# Patient Record
Sex: Female | Born: 1945 | Race: White | Hispanic: No | Marital: Married | Smoking: Never smoker
Health system: Southern US, Community
[De-identification: ages and names within clinical notes are randomized; demographics above are authoritative.]

## PROBLEM LIST (undated history)

## (undated) DIAGNOSIS — M1990 Unspecified osteoarthritis, unspecified site: Secondary | ICD-10-CM

## (undated) DIAGNOSIS — D759 Disease of blood and blood-forming organs, unspecified: Secondary | ICD-10-CM

## (undated) DIAGNOSIS — Z973 Presence of spectacles and contact lenses: Secondary | ICD-10-CM

## (undated) DIAGNOSIS — T4145XA Adverse effect of unspecified anesthetic, initial encounter: Secondary | ICD-10-CM

## (undated) DIAGNOSIS — I10 Essential (primary) hypertension: Secondary | ICD-10-CM

## (undated) DIAGNOSIS — K219 Gastro-esophageal reflux disease without esophagitis: Secondary | ICD-10-CM

## (undated) DIAGNOSIS — F329 Major depressive disorder, single episode, unspecified: Secondary | ICD-10-CM

## (undated) DIAGNOSIS — J302 Other seasonal allergic rhinitis: Secondary | ICD-10-CM

## (undated) DIAGNOSIS — F419 Anxiety disorder, unspecified: Secondary | ICD-10-CM

## (undated) DIAGNOSIS — F32A Depression, unspecified: Secondary | ICD-10-CM

## (undated) DIAGNOSIS — T8859XA Other complications of anesthesia, initial encounter: Secondary | ICD-10-CM

## (undated) HISTORY — PX: APPENDECTOMY: SHX54

## (undated) HISTORY — PX: COLONOSCOPY: SHX174

## (undated) HISTORY — PX: DILATION AND CURETTAGE OF UTERUS: SHX78

## (undated) NOTE — *Deleted (*Deleted)
[facility] EMERGENCY DEPARTMENT Provider Note   CSN: [id number] Arrival date & time: 01/11/20  1147     History Chief Complaint  Patient presents with  . Headache    Melissa Wilkinson is a 49 y.o. female.  HPI     Past Medical History:  Diagnosis Date  . Anxiety   . Arthritis   . Blood dyscrasia    family history of c protien deficiency   . Complication of anesthesia    difficult waking after c section  . Depression   . GERD (gastroesophageal reflux disease)   . Hypertension   . Seasonal allergies   . Wears glasses     Patient Active Problem List   Diagnosis Date Noted  . DJD (degenerative joint disease) of knee 03/20/2013    Past Surgical History:  Procedure Laterality Date  . APPENDECTOMY    . CESAREAN SECTION     x2  . COLONOSCOPY    . DILATION AND CURETTAGE OF UTERUS    . KNEE ARTHROSCOPY  1989   left  . KNEE ARTHROSCOPY WITH MEDIAL MENISECTOMY Right 09/13/2012   Procedure: RIGHT KNEE ARTHROSCOPY WITH PARTIAL MEDIAL LATERAL MENISECTOMY CHRONDROPLASTY;  Surgeon: Loreta Ave, MD;  Location: [facility];  Service: Orthopedics;  Laterality: Right;  . TOTAL KNEE ARTHROPLASTY Right 03/20/2013   DR MURPHY  . TOTAL KNEE ARTHROPLASTY Right 03/20/2013   Procedure: RIGHT TOTAL KNEE ARTHROPLASTY;  Surgeon: Loreta Ave, MD;  Location: [facility] OR;  Service: Orthopedics;  Laterality: Right;     OB History   No obstetric history on file.     No family history on file.  Social History   Tobacco Use  . Smoking status: Never Smoker  . Smokeless tobacco: Never Used  Substance Use Topics  . Alcohol use: Yes    Comment: daily to occ  . Drug use: No    Home Medications Prior to Admission medications   Medication Sig Start Date End Date Taking? Authorizing Provider  ALPRAZolam Prudy Feeler) 1 MG tablet Take 0.5-1 mg by mouth 3 (three) times daily as needed for anxiety or sleep.     [provider]  aspirin EC 325 MG EC tablet  Take 1 tablet (325 mg total) by mouth daily with breakfast. 03/21/13   Cristie Hem, PA-C  aspirin EC 325 MG tablet Take 1 tablet (325 mg total) by mouth daily. 03/20/13   Cristie Hem, PA-C  diclofenac sodium (VOLTAREN) 1 % GEL APPLY 2 GRAMS TO AFFECTED AREA 4 TIMES A DAY 08/29/18   Tarry Kos, MD  DULoxetine (CYMBALTA) 60 MG capsule Take 60 mg by mouth every evening.     [provider]  fluticasone (FLONASE) 50 MCG/ACT nasal spray Place 2 sprays into the nose daily as needed for allergies. For allergy symptoms    [provider]  Fluticasone-Salmeterol (ADVAIR) 250-50 MCG/DOSE AEPB Inhale 1 puff into the lungs daily as needed (asthma).    [provider]  ketotifen (ZADITOR) 0.025 % ophthalmic solution Place 2 drops into both eyes daily as needed (allergies).    [provider]  methocarbamol (ROBAXIN) 500 MG tablet Take 1 tablet (500 mg total) by mouth every 6 (six) hours as needed for muscle spasms. 03/20/13   Cristie Hem, PA-C  olmesartan-hydrochlorothiazide (BENICAR HCT) 40-25 MG per tablet Take 1 tablet by mouth daily.    [provider]  omeprazole (PRILOSEC) 20 MG capsule Take 20 mg by mouth daily.  [provider]  ondansetron (ZOFRAN) 4 MG tablet Take 1 tablet (4 mg total) by mouth every 8 (eight) hours as needed for nausea or vomiting. 03/20/13   Cristie Hem, PA-C  oxyCODONE-acetaminophen (PERCOCET/ROXICET) 5-325 MG per tablet Take 1-2 tablets by mouth every 4 (four) hours as needed for severe pain. 03/21/13   Cristie Hem, PA-C  oxyCODONE-acetaminophen (ROXICET) 5-325 MG per tablet Take 1-2 tablets by mouth every 4 (four) hours as needed for severe pain. 03/20/13   Cristie Hem, PA-C  predniSONE (STERAPRED UNI-PAK 21 TAB) 10 MG (21) TBPK tablet Take as directed 10/06/17   Tarry Kos, MD  traMADol (ULTRAM) 50 MG tablet Take 1 tablet (50 mg total) by mouth 2 (two) times daily as needed. 08/10/18   Cristie Hem, PA-C  VALTREX 500 MG tablet Take 1 tablet (500 mg total) by mouth daily. 03/21/13   Cristie Hem, PA-C    Allergies    Hydrocodone and Morphine and related  Review of Systems   Review of Systems  Physical Exam Updated Vital Signs BP 129/64 (BP Location: Right Arm)   Pulse 89   Temp 98.6 F (37 C) (Oral)   Resp 16   Ht 5' (1.524 m)   Wt 72.6 kg   SpO2 100%   BMI 31.25 kg/m   Physical Exam  ED Results / Procedures / Treatments   Labs (all labs ordered are listed, but only abnormal results are displayed) Labs Reviewed - No data to display  EKG None  Radiology CT Head Wo Contrast  Result Date: 01/11/2020 CLINICAL DATA:  Headache, intracranial hemorrhage suspected EXAM: CT HEAD WITHOUT CONTRAST TECHNIQUE: Contiguous axial images were obtained from the base of the skull through the vertex without intravenous contrast. COMPARISON:  None. FINDINGS: Brain: No evidence of acute infarction, hemorrhage, hydrocephalus, extra-axial collection or mass lesion/mass effect. Mild periventricular and deep white matter hypodensity. Vascular: No hyperdense vessel or unexpected calcification. Skull: Normal. Negative for fracture or focal lesion. Sinuses/Orbits: No acute finding. Other: None. IMPRESSION: 1. No acute intracranial pathology. No non-contrast CT findings to explain headache. 2. Small-vessel white matter disease. Electronically Signed   By: Lauralyn Primes M.D.   On: 01/11/2020 13:21    Procedures Procedures (including critical care time)  Medications Ordered in ED Medications - No data to display  ED Course  I have reviewed the triage vital signs and the nursing notes.  Pertinent labs & imaging results that were available during my care of the patient were reviewed by me and considered in my medical decision making (see chart for details).    MDM Rules/Calculators/A&P                          *** Final Clinical Impression(s) / ED Diagnoses Final diagnoses:  None     Rx / DC Orders ED Discharge Orders    None

---

## 1987-02-22 HISTORY — PX: KNEE ARTHROSCOPY: SUR90

## 1997-07-31 ENCOUNTER — Other Ambulatory Visit: Admission: RE | Admit: 1997-07-31 | Discharge: 1997-07-31 | Payer: Self-pay | Admitting: Obstetrics and Gynecology

## 1998-09-30 ENCOUNTER — Other Ambulatory Visit: Admission: RE | Admit: 1998-09-30 | Discharge: 1998-09-30 | Payer: Self-pay | Admitting: Obstetrics and Gynecology

## 2001-11-02 ENCOUNTER — Other Ambulatory Visit: Admission: RE | Admit: 2001-11-02 | Discharge: 2001-11-02 | Payer: Self-pay | Admitting: Obstetrics and Gynecology

## 2007-03-21 ENCOUNTER — Ambulatory Visit: Payer: Self-pay | Admitting: Psychiatry

## 2008-09-18 ENCOUNTER — Other Ambulatory Visit: Admission: RE | Admit: 2008-09-18 | Discharge: 2008-09-18 | Payer: Self-pay | Admitting: Family Medicine

## 2011-11-24 DIAGNOSIS — I10 Essential (primary) hypertension: Secondary | ICD-10-CM | POA: Diagnosis not present

## 2011-11-24 DIAGNOSIS — M2550 Pain in unspecified joint: Secondary | ICD-10-CM | POA: Diagnosis not present

## 2011-11-24 DIAGNOSIS — F411 Generalized anxiety disorder: Secondary | ICD-10-CM | POA: Diagnosis not present

## 2011-11-24 DIAGNOSIS — K219 Gastro-esophageal reflux disease without esophagitis: Secondary | ICD-10-CM | POA: Diagnosis not present

## 2011-12-01 DIAGNOSIS — Z1231 Encounter for screening mammogram for malignant neoplasm of breast: Secondary | ICD-10-CM | POA: Diagnosis not present

## 2011-12-28 ENCOUNTER — Emergency Department (HOSPITAL_COMMUNITY): Payer: Medicare Other

## 2011-12-28 ENCOUNTER — Emergency Department (HOSPITAL_COMMUNITY)
Admission: EM | Admit: 2011-12-28 | Discharge: 2011-12-28 | Disposition: A | Discharge status: Home / Self Care | Payer: Medicare Other | Attending: Emergency Medicine | Admitting: Emergency Medicine

## 2011-12-28 ENCOUNTER — Encounter (HOSPITAL_COMMUNITY): Payer: Self-pay | Admitting: Emergency Medicine

## 2011-12-28 DIAGNOSIS — Y92009 Unspecified place in unspecified non-institutional (private) residence as the place of occurrence of the external cause: Secondary | ICD-10-CM | POA: Insufficient documentation

## 2011-12-28 DIAGNOSIS — S51812A Laceration without foreign body of left forearm, initial encounter: Secondary | ICD-10-CM

## 2011-12-28 DIAGNOSIS — S51009A Unspecified open wound of unspecified elbow, initial encounter: Secondary | ICD-10-CM | POA: Diagnosis not present

## 2011-12-28 DIAGNOSIS — Y9389 Activity, other specified: Secondary | ICD-10-CM | POA: Insufficient documentation

## 2011-12-28 DIAGNOSIS — Z791 Long term (current) use of non-steroidal anti-inflammatories (NSAID): Secondary | ICD-10-CM | POA: Diagnosis not present

## 2011-12-28 DIAGNOSIS — S51809A Unspecified open wound of unspecified forearm, initial encounter: Secondary | ICD-10-CM | POA: Diagnosis not present

## 2011-12-28 DIAGNOSIS — I10 Essential (primary) hypertension: Secondary | ICD-10-CM | POA: Insufficient documentation

## 2011-12-28 DIAGNOSIS — W01119A Fall on same level from slipping, tripping and stumbling with subsequent striking against unspecified sharp object, initial encounter: Secondary | ICD-10-CM | POA: Insufficient documentation

## 2011-12-28 DIAGNOSIS — Z8739 Personal history of other diseases of the musculoskeletal system and connective tissue: Secondary | ICD-10-CM | POA: Diagnosis not present

## 2011-12-28 HISTORY — DX: Essential (primary) hypertension: I10

## 2011-12-28 HISTORY — DX: Unspecified osteoarthritis, unspecified site: M19.90

## 2011-12-28 MED ORDER — HYDROCODONE-ACETAMINOPHEN 5-325 MG PO TABS: 2.0000 | ORAL_TABLET | ORAL | Status: DC | PRN

## 2011-12-28 NOTE — ED Notes (Signed)
Pt fell on glass cup.  AC has large skin tear. Pt able to move all fingers. No complain of pain. Saline gauze placed on wound and suture cart placed in hall.

## 2011-12-28 NOTE — ED Provider Notes (Signed)
Medical screening examination/treatment/procedure(s) were performed by non-physician practitioner and as supervising physician I was immediately available for consultation/collaboration.   Isley Zinni, MD 12/28/11 0657 

## 2011-12-28 NOTE — ED Provider Notes (Signed)
History     CSN: [id number]  Arrival date & time 12/28/11  0125   First MD Initiated Contact with Patient 12/28/11 [phone]      Chief Complaint  Patient presents with  . Extremity Laceration    (Consider location/radiation/quality/duration/timing/severity/associated sxs/prior treatment) HPI Comments: Patient states she fell asleep in her bed with a glass and when she went to get up and change positions.  She fell, lacerating her left antecubital fossa, with a glass that fell to the floor, and broke.  She is up to date in her tetanus is full.  Range of motion of her fingers, wrist, and elbow.  She has no numbness or tingling x-ray reveals no foreign body  The history is provided by the patient.    Past Medical History  Diagnosis Date  . Hypertension   . Arthritis     History reviewed. No pertinent past surgical history.  No family history on file.  History  Substance Use Topics  . Smoking status: Never Smoker   . Smokeless tobacco: Not on file  . Alcohol Use: No    OB History    Grav Para Term Preterm Abortions TAB SAB Ect Mult Living                  Review of Systems  Constitutional: Negative for fever and chills.  Skin: Positive for wound.  Neurological: Negative for dizziness, weakness and numbness.    Allergies  Morphine and related  Home Medications   Current Outpatient Rx  Name  Route  Sig  Dispense  Refill  . ALPRAZOLAM 1 MG PO TABS   Oral   Take 1 mg by mouth daily as needed. For anxiety         . DULOXETINE HCL 60 MG PO CPEP   Oral   Take 60 mg by mouth daily.         Marland Kitchen FLUTICASONE PROPIONATE 50 MCG/ACT NA SUSP   Nasal   Place 2 sprays into the nose daily. For allergy symptoms         . MELOXICAM 7.5 MG PO TABS   Oral   Take 7.5 mg by mouth daily as needed. For arthritis pain           BP 124/66  Pulse 96  Temp 97.6 F (36.4 C)  Resp 16  Wt 163 lb (73.936 kg)  SpO2 95%  Physical Exam  Constitutional: She is oriented to  person, place, and time. She appears well-developed and well-nourished.  HENT:  Head: Normocephalic.  Eyes: Pupils are equal, round, and reactive to light.  Neck: Normal range of motion.  Cardiovascular: Normal rate.   Pulmonary/Chest: Effort normal.  Musculoskeletal: Normal range of motion. She exhibits tenderness. She exhibits no edema.  Neurological: She is alert and oriented to person, place, and time.  Skin: Skin is warm and dry.       ED Course  LACERATION REPAIR Date/Time: 12/28/2011 3:01 AM Performed by: Arman Filter Authorized by: Arman Filter Consent: Verbal consent obtained. Risks and benefits: risks, benefits and alternatives were discussed Consent given by: patient Patient understanding: patient states understanding of the procedure being performed Patient identity confirmed: verbally with patient Time out: Immediately prior to procedure a "time out" was called to verify the correct patient, procedure, equipment, support staff and site/side marked as required. Body area: upper extremity Location details: left elbow Laceration length: 4 cm Foreign bodies: no foreign bodies Tendon involvement: none Nerve involvement: none Vascular  damage: no Anesthesia: local infiltration Local anesthetic: lidocaine 2% with epinephrine Anesthetic total: 4 ml Patient sedated: no Preparation: Patient was prepped and draped in the usual sterile fashion. Irrigation solution: saline Irrigation method: syringe Amount of cleaning: standard Debridement: none Degree of undermining: none Skin closure: 4-0 nylon Subcutaneous closure: 5-0 Chromic gut Number of sutures: 10 Technique: simple Approximation: close Approximation difficulty: simple Dressing: 4x4 sterile gauze, gauze roll and pressure dressing Patient tolerance: Patient tolerated the procedure well with no immediate complications.   (including critical care time)  Labs Reviewed - No data to display Dg Forearm  Left  12/28/2011  *RADIOLOGY REPORT*  Clinical Data: Cut arm on glass; laceration near the elbow.  LEFT FOREARM - 2 VIEW  Comparison: None.  Findings: No retained glass fragments are identified.  A prominent laceration is noted along the lateral and volar aspect of the proximal left forearm, near the elbow joint.  There is no evidence of fracture or dislocation.  The radius and ulna appear intact.  Negative ulnar variance is noted.  The carpal rows are grossly unremarkable in appearance.  No elbow joint effusion is identified.  IMPRESSION:  1.  No retained glass fragments seen. 2.  Prominent laceration along the lateral and volar aspect of the proximal forearm.   Original Report Authenticated By: Tonia Ghent, M.D.      1. Laceration of left forearm without foreign body       MDM  Laceration to the left forearm, just below the antecubital fossa proximally, 4 cm long, deeper in the central location in the lateral location.  No active bleeding at this time         Arman Filter, NP 12/28/11 1610  Arman Filter, NP 12/28/11 [phone]

## 2011-12-28 NOTE — ED Notes (Signed)
Pt alert, nad, arrives from home, c/o ? Skin tear to left elbow after fall, DSD applied at home, resp even unlabored, skin pwd

## 2012-01-10 DIAGNOSIS — H2510 Age-related nuclear cataract, unspecified eye: Secondary | ICD-10-CM | POA: Diagnosis not present

## 2012-01-10 DIAGNOSIS — H5210 Myopia, unspecified eye: Secondary | ICD-10-CM | POA: Diagnosis not present

## 2012-01-11 DIAGNOSIS — Z4802 Encounter for removal of sutures: Secondary | ICD-10-CM | POA: Diagnosis not present

## 2012-01-11 DIAGNOSIS — Z1331 Encounter for screening for depression: Secondary | ICD-10-CM | POA: Diagnosis not present

## 2012-01-11 DIAGNOSIS — Z23 Encounter for immunization: Secondary | ICD-10-CM | POA: Diagnosis not present

## 2012-02-03 DIAGNOSIS — M25569 Pain in unspecified knee: Secondary | ICD-10-CM | POA: Diagnosis not present

## 2012-02-03 DIAGNOSIS — M1710 Unilateral primary osteoarthritis, unspecified knee: Secondary | ICD-10-CM | POA: Diagnosis not present

## 2012-06-27 DIAGNOSIS — M1710 Unilateral primary osteoarthritis, unspecified knee: Secondary | ICD-10-CM | POA: Diagnosis not present

## 2012-06-28 DIAGNOSIS — K219 Gastro-esophageal reflux disease without esophagitis: Secondary | ICD-10-CM | POA: Diagnosis not present

## 2012-06-28 DIAGNOSIS — Z1211 Encounter for screening for malignant neoplasm of colon: Secondary | ICD-10-CM | POA: Diagnosis not present

## 2012-06-28 DIAGNOSIS — I10 Essential (primary) hypertension: Secondary | ICD-10-CM | POA: Diagnosis not present

## 2012-06-28 DIAGNOSIS — M2550 Pain in unspecified joint: Secondary | ICD-10-CM | POA: Diagnosis not present

## 2012-06-28 DIAGNOSIS — Z124 Encounter for screening for malignant neoplasm of cervix: Secondary | ICD-10-CM | POA: Diagnosis not present

## 2012-06-28 DIAGNOSIS — F329 Major depressive disorder, single episode, unspecified: Secondary | ICD-10-CM | POA: Diagnosis not present

## 2012-07-03 DIAGNOSIS — E871 Hypo-osmolality and hyponatremia: Secondary | ICD-10-CM | POA: Diagnosis not present

## 2012-08-22 DIAGNOSIS — Z1211 Encounter for screening for malignant neoplasm of colon: Secondary | ICD-10-CM | POA: Diagnosis not present

## 2012-09-11 DIAGNOSIS — M1710 Unilateral primary osteoarthritis, unspecified knee: Secondary | ICD-10-CM | POA: Diagnosis not present

## 2012-09-12 ENCOUNTER — Other Ambulatory Visit: Payer: Self-pay | Admitting: Orthopedic Surgery

## 2012-09-12 ENCOUNTER — Other Ambulatory Visit (HOSPITAL_BASED_OUTPATIENT_CLINIC_OR_DEPARTMENT_OTHER): Payer: Medicare Other

## 2012-09-12 ENCOUNTER — Encounter (HOSPITAL_BASED_OUTPATIENT_CLINIC_OR_DEPARTMENT_OTHER)
Admission: RE | Admit: 2012-09-12 | Discharge: 2012-09-12 | Disposition: A | Discharge status: Home / Self Care | Payer: Medicare Other | Source: Ambulatory Visit | Attending: Orthopedic Surgery | Admitting: Orthopedic Surgery

## 2012-09-12 ENCOUNTER — Encounter (HOSPITAL_BASED_OUTPATIENT_CLINIC_OR_DEPARTMENT_OTHER): Payer: Self-pay | Admitting: *Deleted

## 2012-09-12 DIAGNOSIS — S83289A Other tear of lateral meniscus, current injury, unspecified knee, initial encounter: Secondary | ICD-10-CM | POA: Diagnosis not present

## 2012-09-12 DIAGNOSIS — K219 Gastro-esophageal reflux disease without esophagitis: Secondary | ICD-10-CM | POA: Diagnosis not present

## 2012-09-12 DIAGNOSIS — I10 Essential (primary) hypertension: Secondary | ICD-10-CM | POA: Diagnosis not present

## 2012-09-12 DIAGNOSIS — E669 Obesity, unspecified: Secondary | ICD-10-CM | POA: Diagnosis not present

## 2012-09-12 DIAGNOSIS — IMO0002 Reserved for concepts with insufficient information to code with codable children: Secondary | ICD-10-CM | POA: Diagnosis not present

## 2012-09-12 DIAGNOSIS — Z79899 Other long term (current) drug therapy: Secondary | ICD-10-CM | POA: Diagnosis not present

## 2012-09-12 LAB — BASIC METABOLIC PANEL
CO2: 27 mEq/L (ref 19–32)
Calcium: 9.2 mg/dL (ref 8.4–10.5)
Chloride: 88 mEq/L — ABNORMAL LOW (ref 96–112)
GFR calc Af Amer: >90 mL/min (ref >90)
Sodium: 126 mEq/L — ABNORMAL LOW (ref 135–145)

## 2012-09-12 NOTE — Progress Notes (Signed)
To come in for bmet-called for ekg 3/14 dr fried

## 2012-09-12 NOTE — H&P (Signed)
[facility] [address]   [address] [place], [place] [address] [phone] A Division of [facility]  Loreta Ave, M.D.   Robert A. Thurston Hole, M.D.   Burnell Blanks, M.D.   Eulas Post, M.D.   Lunette Stands, M.D Buford Dresser, M.D.  Charlsie Quest, M.D.   Estell Harpin, M.D.   Melina Fiddler, M.D. Genene Churn. Barry Dienes, PA-C            Kirstin A. Shepperson, PA-C [address] [place], PA-C [place], [place]   RE: Jame, Seelig                                [phone]      DOB: 1945/08/14 PROGRESS NOTE: 09-11-12 Melissa Wilkinson is seen for her right knee.  She has had longstanding symptoms off and on, mechanical in nature, in her right knee.  A history of remote treatment of both knees going back to the 1980's.  She doesn't remember which knee I operated on in the past, but with looking at her and looking at where her scars are, she actually had an arthroscopy and meniscectomy of her left knee by me back in the 1980's.  The current issue is her right knee.  Traumatic event back in 2013.  Persistent off and on symptoms since then.  Some global soreness and swelling.  Relatively discreet mechanical symptoms that she feels like are more medial than lateral.  Cortisone injection only transiently helpful for a month or so.  That has worn off.  She would like to pursue definitive treatment.  No real giving way.  Symptoms in the right knee tend to be locking and catching.  Again, she feels like this is more medial than lateral.   History outlined in the note by Dr. Cleophas Dunker back in December.  I have also entirely reviewed her previous history and general exam which is outlined and included in the chart.     EXAMINATION: Specifically, 67 years old.  Relatively marked central obesity.  Antalgic gait on the right.  Negative log roll of both hips.  Negative straight leg raise, both sides.  On the left there is no swelling.  There is some  patellofemoral crepitus.  No meniscus signs.  Stable ligaments.  No effusions.  On the right she has a 2+ effusion.  There is patellofemoral, but not much tibiofemoral, crepitus.  Pain with McMurray's medial and lateral.  Stable ligaments.  Extensor mechanism is intact.  Neurovascularly intact distally.    X-RAYS: Previous x-rays have been reviewed.  She does have lateral patellofemoral positioning with some degenerative changes medial compartment patellofemoral joint, equal right and left.    DISPOSITION:  I do think she probably has a meniscus tear on the right.  There are some underlying degenerative changes patellofemoral joint, medial compartment, both knees, by x-ray.  We have discussed definitive treatment.  I would like to get an MRI beforehand.  That is going to be arranged in short order.  She will call me when that is complete.  We have discussed exam under anesthesia, arthroscopy, debridement of meniscus tears.  Obviously outcome is going to be very dependent on degree of degenerative changes that are found.  Final decision after we see her scan.  She is comfortable with this approach and will be in touch by phone prior to proceeding.  Otherwise I have completed all paperwork.  Procedure,  risks, benefits and complications reviewed in detail with her today.  More than 25 minutes spent face-to-face covering all of this with her and we will again address this over the phone after we see the results of her scan.  Of note, she has had a very good outcome of her left knee which was treated arthroscopically back more than 25 years ago.    Loreta Ave, M.D.   Electronically verified by Loreta Ave, M.D. DFM:jjh D 09-11-12 T 09-12-12

## 2012-09-13 ENCOUNTER — Encounter (HOSPITAL_BASED_OUTPATIENT_CLINIC_OR_DEPARTMENT_OTHER): Payer: Self-pay | Admitting: *Deleted

## 2012-09-13 ENCOUNTER — Encounter (HOSPITAL_BASED_OUTPATIENT_CLINIC_OR_DEPARTMENT_OTHER): Payer: Self-pay | Admitting: Anesthesiology

## 2012-09-13 ENCOUNTER — Ambulatory Visit (HOSPITAL_BASED_OUTPATIENT_CLINIC_OR_DEPARTMENT_OTHER): Payer: Medicare Other | Admitting: Anesthesiology

## 2012-09-13 ENCOUNTER — Encounter (HOSPITAL_BASED_OUTPATIENT_CLINIC_OR_DEPARTMENT_OTHER)
Admission: RE | Disposition: A | Discharge status: Home / Self Care | Payer: Self-pay | Source: Ambulatory Visit | Attending: Orthopedic Surgery

## 2012-09-13 ENCOUNTER — Ambulatory Visit (HOSPITAL_BASED_OUTPATIENT_CLINIC_OR_DEPARTMENT_OTHER)
Admission: RE | Admit: 2012-09-13 | Discharge: 2012-09-13 | Disposition: A | Discharge status: Home / Self Care | Payer: Medicare Other | Source: Ambulatory Visit | Attending: Orthopedic Surgery | Admitting: Orthopedic Surgery

## 2012-09-13 DIAGNOSIS — Z79899 Other long term (current) drug therapy: Secondary | ICD-10-CM | POA: Insufficient documentation

## 2012-09-13 DIAGNOSIS — S83289A Other tear of lateral meniscus, current injury, unspecified knee, initial encounter: Secondary | ICD-10-CM | POA: Insufficient documentation

## 2012-09-13 DIAGNOSIS — Y929 Unspecified place or not applicable: Secondary | ICD-10-CM | POA: Insufficient documentation

## 2012-09-13 DIAGNOSIS — IMO0002 Reserved for concepts with insufficient information to code with codable children: Secondary | ICD-10-CM | POA: Insufficient documentation

## 2012-09-13 DIAGNOSIS — K219 Gastro-esophageal reflux disease without esophagitis: Secondary | ICD-10-CM | POA: Insufficient documentation

## 2012-09-13 DIAGNOSIS — E669 Obesity, unspecified: Secondary | ICD-10-CM | POA: Insufficient documentation

## 2012-09-13 DIAGNOSIS — I10 Essential (primary) hypertension: Secondary | ICD-10-CM | POA: Insufficient documentation

## 2012-09-13 DIAGNOSIS — M9420 Chondromalacia, unspecified site: Secondary | ICD-10-CM | POA: Diagnosis not present

## 2012-09-13 DIAGNOSIS — X58XXXA Exposure to other specified factors, initial encounter: Secondary | ICD-10-CM | POA: Insufficient documentation

## 2012-09-13 HISTORY — PX: KNEE ARTHROSCOPY WITH MEDIAL MENISECTOMY: SHX5651

## 2012-09-13 HISTORY — DX: Major depressive disorder, single episode, unspecified: F32.9

## 2012-09-13 HISTORY — DX: Depression, unspecified: F32.A

## 2012-09-13 HISTORY — DX: Anxiety disorder, unspecified: F41.9

## 2012-09-13 HISTORY — DX: Presence of spectacles and contact lenses: Z97.3

## 2012-09-13 HISTORY — DX: Gastro-esophageal reflux disease without esophagitis: K21.9

## 2012-09-13 SURGERY — ARTHROSCOPY, KNEE, WITH MEDIAL MENISCECTOMY
Anesthesia: General | Site: Knee | Laterality: Right | Wound class: Clean

## 2012-09-13 MED ORDER — IBUPROFEN 100 MG/5ML PO SUSP: 200.0000 mg | Freq: Four times a day (QID) | ORAL | Status: DC | PRN

## 2012-09-13 MED ORDER — PROPOFOL 10 MG/ML IV BOLUS
INTRAVENOUS | Status: DC | PRN
  Administered 2012-09-13: 150 mg via INTRAVENOUS

## 2012-09-13 MED ORDER — KETOROLAC TROMETHAMINE 15 MG/ML IJ SOLN
INTRAMUSCULAR | Status: DC | PRN
  Administered 2012-09-13: 15 mg via INTRAVENOUS

## 2012-09-13 MED ORDER — CEFAZOLIN SODIUM-DEXTROSE 2-3 GM-% IV SOLR
2.0000 g | INTRAVENOUS | Status: AC
  Administered 2012-09-13: 2 g via INTRAVENOUS

## 2012-09-13 MED ORDER — FENTANYL CITRATE 0.05 MG/ML IJ SOLN
INTRAMUSCULAR | Status: DC | PRN
  Administered 2012-09-13: 25 ug via INTRAVENOUS
  Administered 2012-09-13: 50 ug via INTRAVENOUS

## 2012-09-13 MED ORDER — ONDANSETRON HCL 4 MG/2ML IJ SOLN
INTRAMUSCULAR | Status: DC | PRN
  Administered 2012-09-13: 4 mg via INTRAVENOUS

## 2012-09-13 MED ORDER — FENTANYL CITRATE 0.05 MG/ML IJ SOLN: 50.0000 ug | INTRAMUSCULAR | Status: DC | PRN

## 2012-09-13 MED ORDER — FENTANYL CITRATE 0.05 MG/ML IJ SOLN
25.0000 ug | INTRAMUSCULAR | Status: DC | PRN
  Administered 2012-09-13: 50 ug via INTRAVENOUS

## 2012-09-13 MED ORDER — BUPIVACAINE HCL (PF) 0.25 % IJ SOLN
INTRAMUSCULAR | Status: DC | PRN
  Administered 2012-09-13: 20 mL

## 2012-09-13 MED ORDER — MIDAZOLAM HCL 5 MG/5ML IJ SOLN
INTRAMUSCULAR | Status: DC | PRN
  Administered 2012-09-13: 1 mg via INTRAVENOUS

## 2012-09-13 MED ORDER — MIDAZOLAM HCL 2 MG/2ML IJ SOLN: 1.0000 mg | INTRAMUSCULAR | Status: DC | PRN

## 2012-09-13 MED ORDER — LIDOCAINE HCL (CARDIAC) 20 MG/ML IV SOLN
INTRAVENOUS | Status: DC | PRN
  Administered 2012-09-13: 60 mg via INTRAVENOUS

## 2012-09-13 MED ORDER — SODIUM CHLORIDE 0.9 % IR SOLN
Status: DC | PRN
  Administered 2012-09-13: 6000 mL

## 2012-09-13 MED ORDER — IBUPROFEN 200 MG PO TABS
200.0000 mg | ORAL_TABLET | Freq: Four times a day (QID) | ORAL | Status: DC | PRN
Start: 2012-09-13 — End: 2012-09-13

## 2012-09-13 MED ORDER — METOCLOPRAMIDE HCL 5 MG/ML IJ SOLN: 10.0000 mg | Freq: Once | INTRAMUSCULAR | Status: DC | PRN

## 2012-09-13 MED ORDER — CHLORHEXIDINE GLUCONATE 4 % EX LIQD: 60.0000 mL | Freq: Once | CUTANEOUS | Status: DC

## 2012-09-13 MED ORDER — LACTATED RINGERS IV SOLN
INTRAVENOUS | Status: DC
  Administered 2012-09-13: 12:00:00 via INTRAVENOUS

## 2012-09-13 MED ORDER — METHYLPREDNISOLONE ACETATE 40 MG/ML IJ SUSP
INTRAMUSCULAR | Status: DC | PRN
  Administered 2012-09-13: 40 mg via INTRA_ARTICULAR

## 2012-09-13 MED ORDER — LACTATED RINGERS IV SOLN
INTRAVENOUS | Status: DC
  Administered 2012-09-13: 11:00:00 via INTRAVENOUS

## 2012-09-13 MED ORDER — DEXAMETHASONE SODIUM PHOSPHATE 4 MG/ML IJ SOLN
INTRAMUSCULAR | Status: DC | PRN
  Administered 2012-09-13: 10 mg via INTRAVENOUS

## 2012-09-13 MED ORDER — OXYCODONE-ACETAMINOPHEN 5-325 MG PO TABS: 1.0000 | ORAL_TABLET | ORAL | Status: DC | PRN

## 2012-09-13 MED ORDER — LABETALOL HCL 5 MG/ML IV SOLN
INTRAVENOUS | Status: DC | PRN
  Administered 2012-09-13: 2.5 mg via INTRAVENOUS

## 2012-09-13 SURGICAL SUPPLY — 43 items
BANDAGE ELASTIC 6 VELCRO ST LF (GAUZE/BANDAGES/DRESSINGS) ×2 IMPLANT
BLADE CUDA 5.5 (BLADE) IMPLANT
BLADE CUDA GRT WHITE 3.5 (BLADE) IMPLANT
BLADE CUTTER GATOR 3.5 (BLADE) ×2 IMPLANT
BLADE CUTTER MENIS 5.5 (BLADE) IMPLANT
BLADE GREAT WHITE 4.2 (BLADE) ×2 IMPLANT
BUR OVAL 4.0 (BURR) IMPLANT
CANISTER OMNI JUG 16 LITER (MISCELLANEOUS) ×2 IMPLANT
CANISTER SUCTION 2500CC (MISCELLANEOUS) IMPLANT
CLOTH BEACON ORANGE TIMEOUT ST (SAFETY) ×2 IMPLANT
CUTTER MENISCUS  4.2MM (BLADE)
CUTTER MENISCUS 4.2MM (BLADE) IMPLANT
DRAPE ARTHROSCOPY W/POUCH 90 (DRAPES) ×2 IMPLANT
DURAPREP 26ML APPLICATOR (WOUND CARE) ×2 IMPLANT
ELECT MENISCUS 165MM 90D (ELECTRODE) IMPLANT
ELECT REM PT RETURN 9FT ADLT (ELECTROSURGICAL)
ELECTRODE REM PT RTRN 9FT ADLT (ELECTROSURGICAL) IMPLANT
GAUZE XEROFORM 1X8 LF (GAUZE/BANDAGES/DRESSINGS) ×2 IMPLANT
GLOVE BIOGEL M 7.0 STRL (GLOVE) ×1 IMPLANT
GLOVE BIOGEL PI IND STRL 6.5 (GLOVE) IMPLANT
GLOVE BIOGEL PI IND STRL 7.0 (GLOVE) IMPLANT
GLOVE BIOGEL PI IND STRL 7.5 (GLOVE) IMPLANT
GLOVE BIOGEL PI IND STRL 8 (GLOVE) IMPLANT
GLOVE BIOGEL PI INDICATOR 6.5 (GLOVE) ×1
GLOVE BIOGEL PI INDICATOR 7.0 (GLOVE) ×1
GLOVE BIOGEL PI INDICATOR 7.5 (GLOVE) ×2
GLOVE BIOGEL PI INDICATOR 8 (GLOVE) ×1
GLOVE ORTHO TXT STRL SZ7.5 (GLOVE) ×4 IMPLANT
GOWN PREVENTION PLUS XLARGE (GOWN DISPOSABLE) ×5 IMPLANT
GOWN STRL REIN 2XL XLG LVL4 (GOWN DISPOSABLE) ×1 IMPLANT
HOLDER KNEE FOAM BLUE (MISCELLANEOUS) ×2 IMPLANT
IV NS IRRIG 3000ML ARTHROMATIC (IV SOLUTION) ×6 IMPLANT
KNEE WRAP E Z 3 GEL PACK (MISCELLANEOUS) ×1 IMPLANT
PACK ARTHROSCOPY DSU (CUSTOM PROCEDURE TRAY) ×2 IMPLANT
PACK BASIN DAY SURGERY FS (CUSTOM PROCEDURE TRAY) ×2 IMPLANT
PENCIL BUTTON HOLSTER BLD 10FT (ELECTRODE) IMPLANT
SET ARTHROSCOPY TUBING (MISCELLANEOUS) ×2
SET ARTHROSCOPY TUBING LN (MISCELLANEOUS) ×1 IMPLANT
SPONGE GAUZE 4X4 12PLY (GAUZE/BANDAGES/DRESSINGS) ×4 IMPLANT
SUT ETHILON 3 0 PS 1 (SUTURE) ×2 IMPLANT
SUT VIC AB 3-0 FS2 27 (SUTURE) IMPLANT
TOWEL OR 17X24 6PK STRL BLUE (TOWEL DISPOSABLE) ×2 IMPLANT
WATER STERILE IRR 1000ML POUR (IV SOLUTION) ×2 IMPLANT

## 2012-09-13 NOTE — Interval H&P Note (Signed)
History and Physical Interval Note:  09/13/2012 7:27 AM  Melissa Wilkinson  has presented today for surgery, with the diagnosis of RIGHT KNEE MEDIAL MENISCUS TEAR  The various methods of treatment have been discussed with the patient and family. After consideration of risks, benefits and other options for treatment, the patient has consented to  Procedure(s): RIGHT KNEE ARTHROSCOPY WITH MEDIAL MENISECTOMY (Right) as a surgical intervention .  The patient's history has been reviewed, patient examined, no change in status, stable for surgery.  I have reviewed the patient's chart and labs.  Questions were answered to the patient's satisfaction.     Tavon Magnussen F

## 2012-09-13 NOTE — Transfer of Care (Signed)
Immediate Anesthesia Transfer of Care Note  Patient: Melissa Wilkinson  Procedure(s) Performed: Procedure(s): RIGHT KNEE ARTHROSCOPY WITH PARTIAL MEDIAL LATERAL MENISECTOMY CHRONDROPLASTY (Right)  Patient Location: PACU  Anesthesia Type:General  Level of Consciousness: awake, alert  and oriented  Airway & Oxygen Therapy: Patient Spontanous Breathing and Patient connected to face mask oxygen  Post-op Assessment: Report given to PACU RN and Post -op Vital signs reviewed and stable  Post vital signs: Reviewed and stable  Complications: No apparent anesthesia complications

## 2012-09-13 NOTE — Anesthesia Postprocedure Evaluation (Signed)
  Anesthesia Post-op Note  Patient: Melissa Wilkinson  Procedure(s) Performed: Procedure(s): RIGHT KNEE ARTHROSCOPY WITH PARTIAL MEDIAL LATERAL MENISECTOMY CHRONDROPLASTY (Right)  Patient Location: PACU  Anesthesia Type:General  Level of Consciousness: awake, alert  and oriented  Airway and Oxygen Therapy: Patient Spontanous Breathing and Patient connected to face mask oxygen  Post-op Pain: mild  Post-op Assessment: Post-op Vital signs reviewed  Post-op Vital Signs: Reviewed  Complications: No apparent anesthesia complications

## 2012-09-13 NOTE — Anesthesia Preprocedure Evaluation (Signed)
Anesthesia Evaluation  Patient identified by MRN, date of birth, ID band Patient awake    Reviewed: Allergy & Precautions, H&P , NPO status , Patient's Chart, lab work & pertinent test results, reviewed documented beta blocker date and time   Airway Mallampati: II TM Distance: >3 FB Neck ROM: full    Dental   Pulmonary neg pulmonary ROS,  breath sounds clear to auscultation        Cardiovascular hypertension, On Medications Rhythm:regular     Neuro/Psych PSYCHIATRIC DISORDERS negative neurological ROS     GI/Hepatic Neg liver ROS, GERD-  Medicated and Controlled,  Endo/Other  negative endocrine ROS  Renal/GU negative Renal ROS  negative genitourinary   Musculoskeletal   Abdominal   Peds  Hematology negative hematology ROS (+)   Anesthesia Other Findings See surgeon's H&P   Reproductive/Obstetrics negative OB ROS                           Anesthesia Physical Anesthesia Plan  ASA: II  Anesthesia Plan: General   Post-op Pain Management:    Induction: Intravenous  Airway Management Planned: LMA  Additional Equipment:   Intra-op Plan:   Post-operative Plan:   Informed Consent: I have reviewed the patients History and Physical, chart, labs and discussed the procedure including the risks, benefits and alternatives for the proposed anesthesia with the patient or authorized representative who has indicated his/her understanding and acceptance.   Dental Advisory Given  Plan Discussed with: CRNA and Surgeon  Anesthesia Plan Comments:         Anesthesia Quick Evaluation

## 2012-09-13 NOTE — Anesthesia Procedure Notes (Signed)
Procedure Name: LMA Insertion Performed by: Aubrie Lucien W Pre-anesthesia Checklist: Patient identified, Timeout performed, Emergency Drugs available, Suction available and Patient being monitored Patient Re-evaluated:Patient Re-evaluated prior to inductionOxygen Delivery Method: Circle system utilized Preoxygenation: Pre-oxygenation with 100% oxygen Intubation Type: IV induction Ventilation: Mask ventilation without difficulty LMA: LMA inserted LMA Size: 4.0 Number of attempts: 1 Placement Confirmation: breath sounds checked- equal and bilateral and positive ETCO2 Tube secured with: Tape Dental Injury: Teeth and Oropharynx as per pre-operative assessment      

## 2012-09-14 ENCOUNTER — Encounter (HOSPITAL_BASED_OUTPATIENT_CLINIC_OR_DEPARTMENT_OTHER): Payer: Self-pay | Admitting: Orthopedic Surgery

## 2012-09-14 NOTE — Op Note (Signed)
NAME:  Melissa Wilkinson, Melissa Wilkinson          ACCOUNT NO.:  [id number]  MEDICAL RECORD NO.:  [id number]  LOCATION:                               FACILITY:  [facility]  PHYSICIAN:  Loreta Ave, M.D. DATE OF BIRTH:  06/16/1945  DATE OF PROCEDURE:  09/13/2012 DATE OF DISCHARGE:  09/13/2012                              OPERATIVE REPORT   PREOPERATIVE DIAGNOSIS:  Right knee lateral meniscus tear with considerable degenerative changes of the lateral compartment.  POSTOPERATIVE DIAGNOSIS:  Right knee lateral meniscus tear with considerable degenerative changes of the lateral compartment with grade 3 and 4 changes laterally.  Grade 2 and 3 patellofemoral joint, mostly on the trochlea.  Also some degenerative tearing of the medial meniscus, posterior and middle third.  PROCEDURE:  Right knee exam under anesthesia, arthroscopy.  Extensive partial lateral meniscectomy.  Debridement of the medial meniscus. Tricompartmental chondroplasty.  SURGEON:  Loreta Ave, M.D.  ASSISTANT:  Margarita Rana MD.  ANESTHESIA:  General.  ESTIMATED BLOOD LOSS:  Minimal.  SPECIMENS:  None.  CULTURES:  None.  COMPLICATION:  None.  DRESSINGS:  Soft compressive.  TOURNIQUET:  Not employed.  DESCRIPTION OF PROCEDURE:  The patient was brought to the operating room, placed on the operating table in supine position.  After adequate anesthesia had been obtained, leg holder applied.  Leg was prepped and draped in the usual sterile fashion.  Two portals, one each medial and lateral parapatellar.  Arthroscope introduced, knee distended and inspected.  Good patellar tracking.  Patella grade 2 changes, but a lot of grade 3 changes on the trochlea.  Chondroplasty to a stable surface. A lot of reactive synovitis debrided.  ACL intact.  Medial compartment had some mild change on the condyle, not too extreme.  Medial meniscus complex tearing and posterior third saucerized out retaining fair amount meniscus at  completion.  Laterally, the entire anterior half of the meniscus degenerative and torn.  Anterior half removed, tapered into remaining meniscus salvaging the posterior half.  There were grade 4 changes on both the tibia and femur on the lateral aspect.  Grade 3 otherwise.  Not completely denuded, but a fair amount of degenerative change.  Under even surface, chondral loose bodies removed.  Entire knee examined.  No other findings were appreciated.  Instruments were removed.  Portals were closed with nylon.  Knee injected with Depo- Medrol and Marcaine.  Portals were injected with Marcaine.  Sterile compressive dressing applied.  Anesthesia reversed.  Brought to the recovery room.  Tolerated the surgery well with no complications.     Loreta Ave, M.D.     DFM/MEDQ  D:  09/13/2012  T:  09/14/2012  Job:  [id number]

## 2012-09-28 DIAGNOSIS — Z8489 Family history of other specified conditions: Secondary | ICD-10-CM | POA: Diagnosis not present

## 2012-12-04 DIAGNOSIS — M25569 Pain in unspecified knee: Secondary | ICD-10-CM | POA: Diagnosis not present

## 2013-01-08 DIAGNOSIS — F3289 Other specified depressive episodes: Secondary | ICD-10-CM | POA: Diagnosis not present

## 2013-01-08 DIAGNOSIS — Z23 Encounter for immunization: Secondary | ICD-10-CM | POA: Diagnosis not present

## 2013-01-08 DIAGNOSIS — F329 Major depressive disorder, single episode, unspecified: Secondary | ICD-10-CM | POA: Diagnosis not present

## 2013-01-08 DIAGNOSIS — I10 Essential (primary) hypertension: Secondary | ICD-10-CM | POA: Diagnosis not present

## 2013-01-08 DIAGNOSIS — M1710 Unilateral primary osteoarthritis, unspecified knee: Secondary | ICD-10-CM | POA: Diagnosis not present

## 2013-01-08 DIAGNOSIS — K219 Gastro-esophageal reflux disease without esophagitis: Secondary | ICD-10-CM | POA: Diagnosis not present

## 2013-01-08 DIAGNOSIS — M2550 Pain in unspecified joint: Secondary | ICD-10-CM | POA: Diagnosis not present

## 2013-01-08 DIAGNOSIS — F411 Generalized anxiety disorder: Secondary | ICD-10-CM | POA: Diagnosis not present

## 2013-01-14 DIAGNOSIS — Z1231 Encounter for screening mammogram for malignant neoplasm of breast: Secondary | ICD-10-CM | POA: Diagnosis not present

## 2013-01-14 DIAGNOSIS — M1710 Unilateral primary osteoarthritis, unspecified knee: Secondary | ICD-10-CM | POA: Diagnosis not present

## 2013-01-14 DIAGNOSIS — R300 Dysuria: Secondary | ICD-10-CM | POA: Diagnosis not present

## 2013-01-22 DIAGNOSIS — M1710 Unilateral primary osteoarthritis, unspecified knee: Secondary | ICD-10-CM | POA: Diagnosis not present

## 2013-03-05 ENCOUNTER — Other Ambulatory Visit: Payer: Self-pay | Admitting: Physician Assistant

## 2013-03-05 DIAGNOSIS — M1710 Unilateral primary osteoarthritis, unspecified knee: Secondary | ICD-10-CM | POA: Diagnosis not present

## 2013-03-05 NOTE — H&P (Signed)
TOTAL KNEE ADMISSION H&P  Patient is being admitted for right total knee arthroplasty.  Subjective:  Chief Complaint:right knee pain.  HPI: Melissa Wilkinson, 68 y.o. female, has a history of pain and functional disability in the right knee due to arthritis and has failed non-surgical conservative treatments for greater than 12 weeks to includeNSAID's and/or analgesics, corticosteriod injections, viscosupplementation injections and activity modification.  Onset of symptoms was gradual, starting 4 years ago with gradually worsening course since that time. The patient noted prior procedures on the knee to include  arthroscopy on the right knee(s).  Patient currently rates pain in the right knee(s) at 8 out of 10 with activity. Patient has night pain, worsening of pain with activity and weight bearing, pain that interferes with activities of daily living, pain with passive range of motion, crepitus and joint swelling.  Patient has evidence of periarticular osteophytes and joint space narrowing by imaging studies. There is no active infection.  There are no active problems to display for this patient.  Past Medical History  Diagnosis Date  . Hypertension   . Arthritis   . Wears glasses   . Depression   . Anxiety   . GERD (gastroesophageal reflux disease)     Past Surgical History  Procedure Laterality Date  . Cesarean section      x2  . Colonoscopy    . Dilation and curettage of uterus    . Knee arthroscopy  1989    left  . Appendectomy    . Knee arthroscopy with medial menisectomy Right 09/13/2012    Procedure: RIGHT KNEE ARTHROSCOPY WITH PARTIAL MEDIAL LATERAL MENISECTOMY CHRONDROPLASTY;  Surgeon: Ninetta Lights, MD;  Location: [place];  Service: Orthopedics;  Laterality: Right;     (Not in a hospital admission) Allergies  Allergen Reactions  . Hydrocodone Nausea And Vomiting  . Morphine And Related Hives and Other (See Comments)    Fever     History   Substance Use Topics  . Smoking status: Never Smoker   . Smokeless tobacco: Not on file  . Alcohol Use: Yes     Comment: daily to occ    No family history on file.   Review of Systems  Constitutional: Negative.   HENT: Negative.   Eyes: Negative.   Respiratory: Negative.   Cardiovascular: Negative.   Gastrointestinal: Negative.   Genitourinary: Negative.   Musculoskeletal: Positive for joint pain.  Skin: Negative.   Neurological: Negative.   Endo/Heme/Allergies: Negative.   Psychiatric/Behavioral: Negative.     Objective:  Physical Exam  Constitutional: She is oriented to person, place, and time. She appears well-developed and well-nourished.  HENT:  Head: Normocephalic and atraumatic.  Eyes: EOM are normal. Pupils are equal, round, and reactive to light.  Neck: Neck supple.  Cardiovascular: Normal rate and regular rhythm.  Exam reveals no gallop and no friction rub.   No murmur heard. Respiratory: Effort normal and breath sounds normal.  GI: Soft. Bowel sounds are normal.  Musculoskeletal:  Trace effusion.  She has fairly good motion.  She has good stability.  There is grating, crepitus and narrowing laterally with a little increased valgus.  No evidence of infection or DVT.    Neurological: She is alert and oriented to person, place, and time.  Skin: Skin is warm and dry.  Psychiatric: She has a normal mood and affect. Her behavior is normal. Judgment and thought content normal.    Vital signs in last 24 hours: @VSRANGES @  Labs:  Estimated body mass index is 34.40 kg/(m^2) as calculated from the following:   Height as of 09/13/12: 5' (1.524 m).   Weight as of 09/13/12: 79.89 kg (176 lb 2 oz).   Imaging Review Plain radiographs demonstrate severe degenerative joint disease of the right knee(s). The overall alignment ismild valgus. The bone quality appears to be fair for age and reported activity level.  Assessment/Plan:  End stage arthritis, right knee    The patient history, physical examination, clinical judgment of the provider and imaging studies are consistent with end stage degenerative joint disease of the right knee(s) and total knee arthroplasty is deemed medically necessary. The treatment options including medical management, injection therapy arthroscopy and arthroplasty were discussed at length. The risks and benefits of total knee arthroplasty were presented and reviewed. The risks due to aseptic loosening, infection, stiffness, patella tracking problems, thromboembolic complications and other imponderables were discussed. The patient acknowledged the explanation, agreed to proceed with the plan and consent was signed. Patient is being admitted for inpatient treatment for surgery, pain control, PT, OT, prophylactic antibiotics, VTE prophylaxis, progressive ambulation and ADL's and discharge planning. The patient is planning to be discharged home with home health services

## 2013-03-11 ENCOUNTER — Encounter (HOSPITAL_COMMUNITY): Payer: Self-pay

## 2013-03-13 ENCOUNTER — Other Ambulatory Visit: Payer: Self-pay | Admitting: Physician Assistant

## 2013-03-14 ENCOUNTER — Other Ambulatory Visit (HOSPITAL_COMMUNITY): Payer: Self-pay | Admitting: *Deleted

## 2013-03-14 ENCOUNTER — Encounter (HOSPITAL_COMMUNITY)
Admission: RE | Admit: 2013-03-14 | Discharge: 2013-03-14 | Disposition: A | Discharge status: Home / Self Care | Payer: Medicare Other | Source: Ambulatory Visit | Attending: Orthopedic Surgery | Admitting: Orthopedic Surgery

## 2013-03-14 ENCOUNTER — Encounter (HOSPITAL_COMMUNITY): Payer: Self-pay

## 2013-03-14 ENCOUNTER — Ambulatory Visit (HOSPITAL_COMMUNITY)
Admission: RE | Admit: 2013-03-14 | Discharge: 2013-03-14 | Disposition: A | Discharge status: Home / Self Care | Payer: Medicare Other | Source: Ambulatory Visit | Attending: Physician Assistant | Admitting: Physician Assistant

## 2013-03-14 DIAGNOSIS — Z01812 Encounter for preprocedural laboratory examination: Secondary | ICD-10-CM | POA: Diagnosis not present

## 2013-03-14 DIAGNOSIS — Z01818 Encounter for other preprocedural examination: Secondary | ICD-10-CM | POA: Insufficient documentation

## 2013-03-14 HISTORY — DX: Other seasonal allergic rhinitis: J30.2

## 2013-03-14 LAB — URINE MICROSCOPIC-ADD ON

## 2013-03-14 LAB — PROTIME-INR
INR: 0.87 (ref 0.00–1.49)
Prothrombin Time: 11.7 seconds (ref 11.6–15.2)

## 2013-03-14 LAB — COMPREHENSIVE METABOLIC PANEL
ALT: 16 U/L (ref 0–35)
AST: 23 U/L (ref 0–37)
Albumin: 4.5 g/dL (ref 3.5–5.2)
Alkaline Phosphatase: 78 U/L (ref 39–117)
BILIRUBIN TOTAL: 0.4 mg/dL (ref 0.3–1.2)
BUN: 14 mg/dL (ref 6–23)
CO2: 26 meq/L (ref 19–32)
CREATININE: 0.75 mg/dL (ref 0.50–1.10)
Calcium: 9.6 mg/dL (ref 8.4–10.5)
Chloride: 91 mEq/L — ABNORMAL LOW (ref 96–112)
GFR calc Af Amer: >90 mL/min (ref >90)
GFR, EST NON AFRICAN AMERICAN: 86 mL/min — AB (ref >90)
Glucose, Bld: 94 mg/dL (ref 70–99)
Potassium: 4.1 mEq/L (ref 3.7–5.3)
Sodium: 134 mEq/L — ABNORMAL LOW (ref 137–147)
Total Protein: 8 g/dL (ref 6.0–8.3)

## 2013-03-14 LAB — CBC WITH DIFFERENTIAL/PLATELET
Basophils Absolute: 0 10*3/uL (ref 0.0–0.1)
Basophils Relative: 0 % (ref 0–1)
Eosinophils Absolute: 0.1 10*3/uL (ref 0.0–0.7)
Eosinophils Relative: 1 % (ref 0–5)
HEMATOCRIT: 41.7 % (ref 36.0–46.0)
HEMOGLOBIN: 14.2 g/dL (ref 12.0–15.0)
LYMPHS ABS: 2 10*3/uL (ref 0.7–4.0)
LYMPHS PCT: 18 % (ref 12–46)
MCH: 31.8 pg (ref 26.0–34.0)
MCHC: 34.1 g/dL (ref 30.0–36.0)
MCV: 93.3 fL (ref 78.0–100.0)
MONO ABS: 0.9 10*3/uL (ref 0.1–1.0)
Monocytes Relative: 8 % (ref 3–12)
Neutro Abs: 7.9 10*3/uL — ABNORMAL HIGH (ref 1.7–7.7)
Neutrophils Relative %: 73 % (ref 43–77)
Platelets: 398 10*3/uL (ref 150–400)
RBC: 4.47 MIL/uL (ref 3.87–5.11)
RDW: 12.4 % (ref 11.5–15.5)
WBC: 10.8 10*3/uL — ABNORMAL HIGH (ref 4.0–10.5)

## 2013-03-14 LAB — URINALYSIS, ROUTINE W REFLEX MICROSCOPIC
Bilirubin Urine: NEGATIVE
GLUCOSE, UA: NEGATIVE mg/dL
Hgb urine dipstick: NEGATIVE
Ketones, ur: NEGATIVE mg/dL
Nitrite: NEGATIVE
PH: 6 (ref 5.0–8.0)
Protein, ur: NEGATIVE mg/dL
Specific Gravity, Urine: 1.025 (ref 1.005–1.030)
Urobilinogen, UA: 0.2 mg/dL (ref 0.0–1.0)

## 2013-03-14 LAB — TYPE AND SCREEN
ABO/RH(D): O POS
Antibody Screen: NEGATIVE

## 2013-03-14 LAB — ABO/RH: ABO/RH(D): O POS

## 2013-03-14 LAB — SURGICAL PCR SCREEN
MRSA, PCR: NEGATIVE
Staphylococcus aureus: NEGATIVE

## 2013-03-14 LAB — APTT: APTT: 26 s (ref 24–37)

## 2013-03-14 MED ORDER — CHLORHEXIDINE GLUCONATE 4 % EX LIQD: 60.0000 mL | Freq: Once | CUTANEOUS | Status: DC

## 2013-03-14 NOTE — Pre-Procedure Instructions (Signed)
Melissa Wilkinson  03/14/2013   Your procedure is scheduled on:  Wednesday, March 20, 2013 at 11:20 AM.   Report to [facility] Entrance "A" Admitting Office at 8:20 AM.   Call this number if you have problems the morning of surgery: [phone]   Remember:   Do not eat food or drink liquids after midnight Tuesday, 03/19/13.   Take these medicines the morning of surgery with A SIP OF WATER: omeprazole (PRILOSEC), ALPRAZolam (XANAX), may use Flonase and Advair if needed. Please bring inhaler with you the day of surgery.     Do not wear jewelry, make-up or nail polish.  Do not wear lotions, powders, or perfumes. You may wear deodorant.  Do not shave 48 hours prior to surgery.   Do not bring valuables to the hospital.  [facility] is not responsible                  for any belongings or valuables.               Contacts, dentures or bridgework may not be worn into surgery.  Leave suitcase in the car. After surgery it may be brought to your room.  For patients admitted to the hospital, discharge time is determined by your                treatment team.                 Special Instructions: Shower using CHG 2 nights before surgery and the night before surgery.  If you shower the day of surgery use CHG.  Use special wash - you have one bottle of CHG for all showers.  You should use approximately 1/3 of the bottle for each shower.   Please read over the following fact sheets that you were given: Pain Booklet, Coughing and Deep Breathing, Blood Transfusion Information, MRSA Information and Surgical Site Infection Prevention

## 2013-03-15 LAB — URINE CULTURE
COLONY COUNT: NO GROWTH
CULTURE: NO GROWTH

## 2013-03-19 MED ORDER — CEFAZOLIN SODIUM-DEXTROSE 2-3 GM-% IV SOLR
2.0000 g | INTRAVENOUS | Status: AC
  Administered 2013-03-20: 2 g via INTRAVENOUS
  Filled 2013-03-19: qty 50

## 2013-03-19 NOTE — Progress Notes (Signed)
Pt. Notified of times change and to arrive at 0730.Message taken by husband Marden Noble. Voices understanding.

## 2013-03-20 ENCOUNTER — Inpatient Hospital Stay (HOSPITAL_COMMUNITY)
Admission: RE | Admit: 2013-03-20 | Discharge: 2013-03-21 | DRG: 470 | Disposition: A | Discharge status: Home Health | Payer: Medicare Other | Source: Ambulatory Visit | Attending: Orthopedic Surgery | Admitting: Orthopedic Surgery

## 2013-03-20 ENCOUNTER — Encounter (HOSPITAL_COMMUNITY): Payer: Medicare Other | Admitting: Certified Registered"

## 2013-03-20 ENCOUNTER — Encounter (HOSPITAL_COMMUNITY): Payer: Self-pay | Admitting: Certified Registered"

## 2013-03-20 ENCOUNTER — Encounter (HOSPITAL_COMMUNITY)
Admission: RE | Disposition: A | Discharge status: Home Health | Payer: Self-pay | Source: Ambulatory Visit | Attending: Orthopedic Surgery

## 2013-03-20 ENCOUNTER — Inpatient Hospital Stay (HOSPITAL_COMMUNITY): Payer: Medicare Other | Admitting: Certified Registered"

## 2013-03-20 ENCOUNTER — Inpatient Hospital Stay (HOSPITAL_COMMUNITY): Payer: Medicare Other

## 2013-03-20 DIAGNOSIS — M1710 Unilateral primary osteoarthritis, unspecified knee: Secondary | ICD-10-CM | POA: Diagnosis not present

## 2013-03-20 DIAGNOSIS — M25869 Other specified joint disorders, unspecified knee: Secondary | ICD-10-CM | POA: Diagnosis not present

## 2013-03-20 DIAGNOSIS — F3289 Other specified depressive episodes: Secondary | ICD-10-CM | POA: Diagnosis present

## 2013-03-20 DIAGNOSIS — Z885 Allergy status to narcotic agent status: Secondary | ICD-10-CM | POA: Diagnosis not present

## 2013-03-20 DIAGNOSIS — K219 Gastro-esophageal reflux disease without esophagitis: Secondary | ICD-10-CM | POA: Diagnosis not present

## 2013-03-20 DIAGNOSIS — I10 Essential (primary) hypertension: Secondary | ICD-10-CM | POA: Diagnosis not present

## 2013-03-20 DIAGNOSIS — F411 Generalized anxiety disorder: Secondary | ICD-10-CM | POA: Diagnosis present

## 2013-03-20 DIAGNOSIS — Z96659 Presence of unspecified artificial knee joint: Secondary | ICD-10-CM | POA: Diagnosis not present

## 2013-03-20 DIAGNOSIS — M25569 Pain in unspecified knee: Secondary | ICD-10-CM | POA: Diagnosis not present

## 2013-03-20 DIAGNOSIS — M179 Osteoarthritis of knee, unspecified: Secondary | ICD-10-CM | POA: Diagnosis present

## 2013-03-20 DIAGNOSIS — IMO0002 Reserved for concepts with insufficient information to code with codable children: Secondary | ICD-10-CM | POA: Diagnosis not present

## 2013-03-20 DIAGNOSIS — B009 Herpesviral infection, unspecified: Secondary | ICD-10-CM | POA: Diagnosis present

## 2013-03-20 DIAGNOSIS — F329 Major depressive disorder, single episode, unspecified: Secondary | ICD-10-CM | POA: Diagnosis present

## 2013-03-20 HISTORY — PX: TOTAL KNEE ARTHROPLASTY: SHX125

## 2013-03-20 HISTORY — DX: Other complications of anesthesia, initial encounter: T88.59XA

## 2013-03-20 HISTORY — DX: Disease of blood and blood-forming organs, unspecified: D75.9

## 2013-03-20 HISTORY — DX: Adverse effect of unspecified anesthetic, initial encounter: T41.45XA

## 2013-03-20 SURGERY — ARTHROPLASTY, KNEE, TOTAL
Anesthesia: Regional | Site: Knee | Laterality: Right

## 2013-03-20 MED ORDER — ZOLPIDEM TARTRATE 5 MG PO TABS: 5.0000 mg | ORAL_TABLET | Freq: Every evening | ORAL | Status: DC | PRN

## 2013-03-20 MED ORDER — KETOROLAC TROMETHAMINE 30 MG/ML IJ SOLN
INTRAMUSCULAR | Status: DC | PRN
  Administered 2013-03-20: 30 mg via INTRAVENOUS

## 2013-03-20 MED ORDER — DEXAMETHASONE 6 MG PO TABS
10.0000 mg | ORAL_TABLET | Freq: Three times a day (TID) | ORAL | Status: AC
  Administered 2013-03-21: 10 mg via ORAL
  Filled 2013-03-20 (×3): qty 1

## 2013-03-20 MED ORDER — MIDAZOLAM HCL 5 MG/5ML IJ SOLN
INTRAMUSCULAR | Status: DC | PRN
  Administered 2013-03-20: 2 mg via INTRAVENOUS

## 2013-03-20 MED ORDER — MIDAZOLAM HCL 2 MG/2ML IJ SOLN
INTRAMUSCULAR | Status: AC
  Filled 2013-03-20: qty 2

## 2013-03-20 MED ORDER — ONDANSETRON HCL 4 MG PO TABS: 4.0000 mg | ORAL_TABLET | Freq: Four times a day (QID) | ORAL | Status: DC | PRN

## 2013-03-20 MED ORDER — ACETAMINOPHEN 10 MG/ML IV SOLN
1000.0000 mg | Freq: Once | INTRAVENOUS | Status: AC
  Administered 2013-03-20: 1000 mg via INTRAVENOUS
  Filled 2013-03-20: qty 100

## 2013-03-20 MED ORDER — CHLORHEXIDINE GLUCONATE 4 % EX LIQD: 60.0000 mL | Freq: Once | CUTANEOUS | Status: DC

## 2013-03-20 MED ORDER — ROCURONIUM BROMIDE 100 MG/10ML IV SOLN
INTRAVENOUS | Status: DC | PRN
Start: 2013-03-20 — End: 2013-03-20
  Administered 2013-03-20: 40 mg via INTRAVENOUS

## 2013-03-20 MED ORDER — METOCLOPRAMIDE HCL 10 MG PO TABS: 5.0000 mg | ORAL_TABLET | Freq: Three times a day (TID) | ORAL | Status: DC | PRN

## 2013-03-20 MED ORDER — PHENYLEPHRINE 40 MCG/ML (10ML) SYRINGE FOR IV PUSH (FOR BLOOD PRESSURE SUPPORT)
PREFILLED_SYRINGE | INTRAVENOUS | Status: AC
  Filled 2013-03-20: qty 10

## 2013-03-20 MED ORDER — OXYCODONE-ACETAMINOPHEN 5-325 MG PO TABS
1.0000 | ORAL_TABLET | ORAL | Status: DC | PRN
  Administered 2013-03-20 – 2013-03-21 (×4): 2 via ORAL
  Filled 2013-03-20 (×4): qty 2

## 2013-03-20 MED ORDER — DOCUSATE SODIUM 100 MG PO CAPS
100.0000 mg | ORAL_CAPSULE | Freq: Two times a day (BID) | ORAL | Status: DC
  Administered 2013-03-20 – 2013-03-21 (×2): 100 mg via ORAL
  Filled 2013-03-20 (×3): qty 1

## 2013-03-20 MED ORDER — CEFAZOLIN SODIUM 1-5 GM-% IV SOLN
1.0000 g | Freq: Four times a day (QID) | INTRAVENOUS | Status: AC
  Administered 2013-03-20: 1 g via INTRAVENOUS
  Filled 2013-03-20 (×2): qty 50

## 2013-03-20 MED ORDER — METOCLOPRAMIDE HCL 5 MG/ML IJ SOLN: 5.0000 mg | Freq: Three times a day (TID) | INTRAMUSCULAR | Status: DC | PRN

## 2013-03-20 MED ORDER — BUPIVACAINE LIPOSOME 1.3 % IJ SUSP
20.0000 mL | Freq: Once | INTRAMUSCULAR | Status: DC
  Filled 2013-03-20: qty 20

## 2013-03-20 MED ORDER — ONDANSETRON HCL 4 MG/2ML IJ SOLN
INTRAMUSCULAR | Status: DC | PRN
  Administered 2013-03-20: 4 mg via INTRAVENOUS

## 2013-03-20 MED ORDER — NEOSTIGMINE METHYLSULFATE 1 MG/ML IJ SOLN
INTRAMUSCULAR | Status: AC
  Filled 2013-03-20: qty 10

## 2013-03-20 MED ORDER — POTASSIUM CHLORIDE IN NACL 20-0.9 MEQ/L-% IV SOLN
INTRAVENOUS | Status: DC
  Administered 2013-03-20: 21:00:00 via INTRAVENOUS
  Filled 2013-03-20 (×3): qty 1000

## 2013-03-20 MED ORDER — HYDROMORPHONE HCL PF 1 MG/ML IJ SOLN
INTRAMUSCULAR | Status: DC | PRN
  Administered 2013-03-20 (×4): .25 mg via INTRAVENOUS

## 2013-03-20 MED ORDER — GLYCOPYRROLATE 0.2 MG/ML IJ SOLN
INTRAMUSCULAR | Status: DC | PRN
  Administered 2013-03-20: 0.4 mg via INTRAVENOUS

## 2013-03-20 MED ORDER — ONDANSETRON HCL 4 MG PO TABS: 4.0000 mg | ORAL_TABLET | Freq: Three times a day (TID) | ORAL | Status: AC | PRN

## 2013-03-20 MED ORDER — ROCURONIUM BROMIDE 50 MG/5ML IV SOLN
INTRAVENOUS | Status: AC
  Filled 2013-03-20: qty 1

## 2013-03-20 MED ORDER — OXYCODONE HCL 5 MG/5ML PO SOLN: 5.0000 mg | Freq: Once | ORAL | Status: AC | PRN

## 2013-03-20 MED ORDER — ALPRAZOLAM 0.5 MG PO TABS
0.5000 mg | ORAL_TABLET | Freq: Three times a day (TID) | ORAL | Status: DC | PRN
  Administered 2013-03-21 (×2): 1 mg via ORAL
  Filled 2013-03-20 (×2): qty 2

## 2013-03-20 MED ORDER — PROPOFOL 10 MG/ML IV BOLUS
INTRAVENOUS | Status: DC | PRN
  Administered 2013-03-20 (×2): 20 mg via INTRAVENOUS
  Administered 2013-03-20: 160 mg via INTRAVENOUS

## 2013-03-20 MED ORDER — ASPIRIN EC 325 MG PO TBEC
325.0000 mg | DELAYED_RELEASE_TABLET | Freq: Every day | ORAL | Status: DC
  Administered 2013-03-21: 325 mg via ORAL
  Filled 2013-03-20 (×2): qty 1

## 2013-03-20 MED ORDER — LACTATED RINGERS IV SOLN
INTRAVENOUS | Status: DC
  Administered 2013-03-20 (×2): via INTRAVENOUS

## 2013-03-20 MED ORDER — ONDANSETRON HCL 4 MG/2ML IJ SOLN: 4.0000 mg | Freq: Four times a day (QID) | INTRAMUSCULAR | Status: DC | PRN

## 2013-03-20 MED ORDER — ASPIRIN EC 325 MG PO TBEC: 325.0000 mg | DELAYED_RELEASE_TABLET | Freq: Every day | ORAL | Status: DC

## 2013-03-20 MED ORDER — 0.9 % SODIUM CHLORIDE (POUR BTL) OPTIME
TOPICAL | Status: DC | PRN
  Administered 2013-03-20: 1000 mL

## 2013-03-20 MED ORDER — BUPIVACAINE HCL (PF) 0.25 % IJ SOLN
INTRAMUSCULAR | Status: DC | PRN
  Administered 2013-03-20: 5 mL

## 2013-03-20 MED ORDER — KETAMINE HCL 100 MG/ML IJ SOLN
INTRAMUSCULAR | Status: DC | PRN
  Administered 2013-03-20: 20 mg via INTRAVENOUS
  Administered 2013-03-20: 10 mg via INTRAVENOUS

## 2013-03-20 MED ORDER — OXYCODONE-ACETAMINOPHEN 5-325 MG PO TABS: 1.0000 | ORAL_TABLET | ORAL | Status: AC | PRN

## 2013-03-20 MED ORDER — HYDROMORPHONE HCL PF 1 MG/ML IJ SOLN
INTRAMUSCULAR | Status: AC
  Filled 2013-03-20: qty 1

## 2013-03-20 MED ORDER — OXYCODONE HCL 5 MG PO TABS
5.0000 mg | ORAL_TABLET | Freq: Once | ORAL | Status: AC | PRN
  Administered 2013-03-20: 5 mg via ORAL

## 2013-03-20 MED ORDER — HYDROCHLOROTHIAZIDE 25 MG PO TABS
25.0000 mg | ORAL_TABLET | Freq: Every day | ORAL | Status: DC
  Administered 2013-03-21: 25 mg via ORAL
  Filled 2013-03-20 (×2): qty 1

## 2013-03-20 MED ORDER — METHOCARBAMOL 500 MG PO TABS: 500.0000 mg | ORAL_TABLET | Freq: Four times a day (QID) | ORAL | Status: AC | PRN

## 2013-03-20 MED ORDER — SUCCINYLCHOLINE CHLORIDE 20 MG/ML IJ SOLN
INTRAMUSCULAR | Status: AC
  Filled 2013-03-20: qty 1

## 2013-03-20 MED ORDER — OLMESARTAN MEDOXOMIL-HCTZ 40-25 MG PO TABS: 1.0000 | ORAL_TABLET | Freq: Every day | ORAL | Status: DC

## 2013-03-20 MED ORDER — LIDOCAINE HCL (CARDIAC) 20 MG/ML IV SOLN
INTRAVENOUS | Status: AC
  Filled 2013-03-20: qty 5

## 2013-03-20 MED ORDER — PROPOFOL 10 MG/ML IV BOLUS
INTRAVENOUS | Status: AC
  Filled 2013-03-20: qty 20

## 2013-03-20 MED ORDER — EPHEDRINE SULFATE 50 MG/ML IJ SOLN
INTRAMUSCULAR | Status: AC
  Filled 2013-03-20: qty 1

## 2013-03-20 MED ORDER — SODIUM CHLORIDE 0.9 % IJ SOLN
INTRAMUSCULAR | Status: DC | PRN
  Administered 2013-03-20: 12:00:00

## 2013-03-20 MED ORDER — PANTOPRAZOLE SODIUM 40 MG PO TBEC
40.0000 mg | DELAYED_RELEASE_TABLET | Freq: Every day | ORAL | Status: DC
  Administered 2013-03-21: 40 mg via ORAL
  Filled 2013-03-20: qty 1

## 2013-03-20 MED ORDER — IRBESARTAN 300 MG PO TABS
300.0000 mg | ORAL_TABLET | Freq: Every day | ORAL | Status: DC
  Administered 2013-03-21: 300 mg via ORAL
  Filled 2013-03-20 (×2): qty 1

## 2013-03-20 MED ORDER — LACTATED RINGERS IV SOLN
INTRAVENOUS | Status: DC
Start: 2013-03-20 — End: 2013-03-20

## 2013-03-20 MED ORDER — DEXAMETHASONE SODIUM PHOSPHATE 10 MG/ML IJ SOLN
10.0000 mg | Freq: Three times a day (TID) | INTRAMUSCULAR | Status: AC
  Administered 2013-03-20: 10 mg via INTRAVENOUS
  Filled 2013-03-20 (×3): qty 1

## 2013-03-20 MED ORDER — OXYCODONE HCL 5 MG PO TABS
ORAL_TABLET | ORAL | Status: AC
  Filled 2013-03-20: qty 1

## 2013-03-20 MED ORDER — PHENOL 1.4 % MT LIQD: 1.0000 | OROMUCOSAL | Status: DC | PRN

## 2013-03-20 MED ORDER — CELECOXIB 200 MG PO CAPS
200.0000 mg | ORAL_CAPSULE | Freq: Two times a day (BID) | ORAL | Status: DC
  Administered 2013-03-20 – 2013-03-21 (×2): 200 mg via ORAL
  Filled 2013-03-20 (×3): qty 1

## 2013-03-20 MED ORDER — METHOCARBAMOL 500 MG PO TABS
500.0000 mg | ORAL_TABLET | Freq: Four times a day (QID) | ORAL | Status: DC | PRN
  Administered 2013-03-20 – 2013-03-21 (×4): 500 mg via ORAL
  Filled 2013-03-20 (×4): qty 1

## 2013-03-20 MED ORDER — LIDOCAINE HCL (CARDIAC) 20 MG/ML IV SOLN
INTRAVENOUS | Status: DC | PRN
  Administered 2013-03-20: 80 mg via INTRAVENOUS

## 2013-03-20 MED ORDER — SODIUM CHLORIDE 0.9 % IJ SOLN
INTRAMUSCULAR | Status: AC
  Filled 2013-03-20: qty 10

## 2013-03-20 MED ORDER — METHOCARBAMOL 500 MG PO TABS
ORAL_TABLET | ORAL | Status: AC
  Filled 2013-03-20: qty 1

## 2013-03-20 MED ORDER — HYDROMORPHONE HCL PF 1 MG/ML IJ SOLN
0.5000 mg | INTRAMUSCULAR | Status: DC | PRN
  Administered 2013-03-20 – 2013-03-21 (×3): 0.5 mg via INTRAVENOUS
  Filled 2013-03-20 (×3): qty 1

## 2013-03-20 MED ORDER — FENTANYL CITRATE 0.05 MG/ML IJ SOLN
INTRAMUSCULAR | Status: AC
  Filled 2013-03-20: qty 5

## 2013-03-20 MED ORDER — KETAMINE HCL 100 MG/ML IJ SOLN
INTRAMUSCULAR | Status: AC
  Filled 2013-03-20: qty 1

## 2013-03-20 MED ORDER — BISACODYL 5 MG PO TBEC: 5.0000 mg | DELAYED_RELEASE_TABLET | Freq: Every day | ORAL | Status: DC | PRN

## 2013-03-20 MED ORDER — GLYCOPYRROLATE 0.2 MG/ML IJ SOLN
INTRAMUSCULAR | Status: AC
  Filled 2013-03-20: qty 2

## 2013-03-20 MED ORDER — ACETAMINOPHEN 650 MG RE SUPP
650.0000 mg | Freq: Four times a day (QID) | RECTAL | Status: DC | PRN
Start: 2013-03-20 — End: 2013-03-21

## 2013-03-20 MED ORDER — KETOTIFEN FUMARATE 0.025 % OP SOLN: 2.0000 [drp] | Freq: Every day | OPHTHALMIC | Status: DC | PRN

## 2013-03-20 MED ORDER — KETOROLAC TROMETHAMINE 30 MG/ML IJ SOLN
INTRAMUSCULAR | Status: AC
  Filled 2013-03-20: qty 1

## 2013-03-20 MED ORDER — FENTANYL CITRATE 0.05 MG/ML IJ SOLN
INTRAMUSCULAR | Status: DC | PRN
  Administered 2013-03-20: 25 ug via INTRAVENOUS
  Administered 2013-03-20: 100 ug via INTRAVENOUS
  Administered 2013-03-20 (×2): 25 ug via INTRAVENOUS
  Administered 2013-03-20: 50 ug via INTRAVENOUS
  Administered 2013-03-20: 100 ug via INTRAVENOUS
  Administered 2013-03-20: 25 ug via INTRAVENOUS

## 2013-03-20 MED ORDER — NEOSTIGMINE METHYLSULFATE 1 MG/ML IJ SOLN
INTRAMUSCULAR | Status: DC | PRN
  Administered 2013-03-20: 3 mg via INTRAVENOUS

## 2013-03-20 MED ORDER — MENTHOL 3 MG MT LOZG: 1.0000 | LOZENGE | OROMUCOSAL | Status: DC | PRN

## 2013-03-20 MED ORDER — DULOXETINE HCL 60 MG PO CPEP
60.0000 mg | ORAL_CAPSULE | Freq: Every evening | ORAL | Status: DC
  Filled 2013-03-20 (×2): qty 1

## 2013-03-20 MED ORDER — ONDANSETRON HCL 4 MG/2ML IJ SOLN: 4.0000 mg | Freq: Once | INTRAMUSCULAR | Status: DC | PRN

## 2013-03-20 MED ORDER — DIPHENHYDRAMINE HCL 12.5 MG/5ML PO ELIX: 12.5000 mg | ORAL_SOLUTION | ORAL | Status: DC | PRN

## 2013-03-20 MED ORDER — ACETAMINOPHEN 325 MG PO TABS: 650.0000 mg | ORAL_TABLET | Freq: Four times a day (QID) | ORAL | Status: DC | PRN

## 2013-03-20 MED ORDER — HYDROMORPHONE HCL PF 1 MG/ML IJ SOLN
0.2500 mg | INTRAMUSCULAR | Status: DC | PRN
  Administered 2013-03-20: 0.5 mg via INTRAVENOUS
  Administered 2013-03-20 (×2): 0.25 mg via INTRAVENOUS

## 2013-03-20 MED ORDER — SODIUM CHLORIDE 0.9 % IR SOLN
Status: DC | PRN
  Administered 2013-03-20: 1000 mL

## 2013-03-20 MED ORDER — METHOCARBAMOL 100 MG/ML IJ SOLN
500.0000 mg | Freq: Four times a day (QID) | INTRAVENOUS | Status: DC | PRN
  Filled 2013-03-20: qty 5

## 2013-03-20 MED ORDER — MOMETASONE FURO-FORMOTEROL FUM 100-5 MCG/ACT IN AERO
2.0000 | INHALATION_SPRAY | Freq: Two times a day (BID) | RESPIRATORY_TRACT | Status: DC
  Filled 2013-03-20: qty 8.8

## 2013-03-20 MED ORDER — BUPIVACAINE HCL (PF) 0.25 % IJ SOLN
INTRAMUSCULAR | Status: AC
  Filled 2013-03-20: qty 30

## 2013-03-20 SURGICAL SUPPLY — 71 items
APL SKNCLS STERI-STRIP NONHPOA (GAUZE/BANDAGES/DRESSINGS)
BANDAGE ESMARK 6X9 LF (GAUZE/BANDAGES/DRESSINGS) ×1 IMPLANT
BENZOIN TINCTURE PRP APPL 2/3 (GAUZE/BANDAGES/DRESSINGS) ×1 IMPLANT
BLADE SAG 18X100X1.27 (BLADE) ×6 IMPLANT
BNDG CMPR 9X6 STRL LF SNTH (GAUZE/BANDAGES/DRESSINGS) ×1
BNDG CMPR MED 10X6 ELC LF (GAUZE/BANDAGES/DRESSINGS) ×1
BNDG ELASTIC 6X10 VLCR STRL LF (GAUZE/BANDAGES/DRESSINGS) ×2 IMPLANT
BNDG ESMARK 6X9 LF (GAUZE/BANDAGES/DRESSINGS) ×3
BOWL SMART MIX CTS (DISPOSABLE) ×3 IMPLANT
CEMENT BONE SIMPLEX SPEEDSET (Cement) ×6 IMPLANT
CLOSURE STERI-STRIP 1/2X4 (GAUZE/BANDAGES/DRESSINGS) ×1
CLOSURE WOUND 1/2 X4 (GAUZE/BANDAGES/DRESSINGS)
CLSR STERI-STRIP ANTIMIC 1/2X4 (GAUZE/BANDAGES/DRESSINGS) ×1 IMPLANT
COVER SURGICAL LIGHT HANDLE (MISCELLANEOUS) ×3 IMPLANT
CUFF TOURNIQUET SINGLE 34IN LL (TOURNIQUET CUFF) ×3 IMPLANT
DRAPE EXTREMITY T 121X128X90 (DRAPE) ×3 IMPLANT
DRAPE PROXIMA HALF (DRAPES) ×3 IMPLANT
DRAPE U-SHAPE 47X51 STRL (DRAPES) ×3 IMPLANT
DRSG PAD ABDOMINAL 8X10 ST (GAUZE/BANDAGES/DRESSINGS) ×3 IMPLANT
DURAPREP 26ML APPLICATOR (WOUND CARE) ×5 IMPLANT
ELECT CAUTERY BLADE 6.4 (BLADE) ×3 IMPLANT
ELECT REM PT RETURN 9FT ADLT (ELECTROSURGICAL) ×3
ELECTRODE REM PT RTRN 9FT ADLT (ELECTROSURGICAL) ×1 IMPLANT
EVACUATOR 1/8 PVC DRAIN (DRAIN) ×3 IMPLANT
FACESHIELD LNG OPTICON STERILE (SAFETY) ×6 IMPLANT
GLOVE BIO SURGEON STRL SZ 6.5 (GLOVE) ×3 IMPLANT
GLOVE BIO SURGEONS STRL SZ 6.5 (GLOVE) ×1
GLOVE BIOGEL PI IND STRL 7.0 (GLOVE) ×2 IMPLANT
GLOVE BIOGEL PI INDICATOR 7.0 (GLOVE) ×6
GLOVE BIOGEL PI ORTHO PRO SZ7 (GLOVE) ×2
GLOVE ORTHO TXT STRL SZ7.5 (GLOVE) ×3 IMPLANT
GLOVE PI ORTHO PRO STRL SZ7 (GLOVE) IMPLANT
GLOVE SURG SS PI 7.0 STRL IVOR (GLOVE) ×6 IMPLANT
GOWN STRL REUS W/ TWL LRG LVL3 (GOWN DISPOSABLE) IMPLANT
GOWN STRL REUS W/ TWL XL LVL3 (GOWN DISPOSABLE) IMPLANT
GOWN STRL REUS W/TWL 2XL LVL3 (GOWN DISPOSABLE) ×3 IMPLANT
GOWN STRL REUS W/TWL LRG LVL3 (GOWN DISPOSABLE) ×6
GOWN STRL REUS W/TWL XL LVL3 (GOWN DISPOSABLE) ×6
HANDPIECE INTERPULSE COAX TIP (DISPOSABLE) ×3
IMMOBILIZER KNEE 20 (SOFTGOODS) ×3
IMMOBILIZER KNEE 20 THIGH 36 (SOFTGOODS) IMPLANT
IMMOBILIZER KNEE 22 UNIV (SOFTGOODS) ×1 IMPLANT
IMMOBILIZER KNEE 24 THIGH 36 (MISCELLANEOUS) IMPLANT
IMMOBILIZER KNEE 24 UNIV (MISCELLANEOUS)
KIT BASIN OR (CUSTOM PROCEDURE TRAY) ×3 IMPLANT
KIT ROOM TURNOVER OR (KITS) ×3 IMPLANT
KNEE/VIT E POLY LINER LEVEL 1B ×2 IMPLANT
MANIFOLD NEPTUNE II (INSTRUMENTS) ×3 IMPLANT
NDL HYPO 25GX1X1/2 BEV (NEEDLE) ×1 IMPLANT
NEEDLE HYPO 25GX1X1/2 BEV (NEEDLE) ×3 IMPLANT
NS IRRIG 1000ML POUR BTL (IV SOLUTION) ×3 IMPLANT
PACK TOTAL JOINT (CUSTOM PROCEDURE TRAY) ×3 IMPLANT
PAD ARMBOARD 7.5X6 YLW CONV (MISCELLANEOUS) ×4 IMPLANT
PAD CAST 4YDX4 CTTN HI CHSV (CAST SUPPLIES) ×1 IMPLANT
PADDING CAST COTTON 4X4 STRL (CAST SUPPLIES) ×3
PADDING CAST COTTON 6X4 STRL (CAST SUPPLIES) ×3 IMPLANT
SET HNDPC FAN SPRY TIP SCT (DISPOSABLE) ×1 IMPLANT
SPONGE GAUZE 4X4 12PLY (GAUZE/BANDAGES/DRESSINGS) ×3 IMPLANT
STRIP CLOSURE SKIN 1/2X4 (GAUZE/BANDAGES/DRESSINGS) ×2 IMPLANT
SUCTION FRAZIER TIP 10 FR DISP (SUCTIONS) ×3 IMPLANT
SUT MNCRL AB 4-0 PS2 18 (SUTURE) ×3 IMPLANT
SUT MON AB 2-0 CT1 36 (SUTURE) ×3 IMPLANT
SUT VIC AB 0 CT1 27 (SUTURE) ×3
SUT VIC AB 0 CT1 27XBRD ANBCTR (SUTURE) ×1 IMPLANT
SUT VIC AB 2-0 CT1 27 (SUTURE)
SUT VIC AB 2-0 CT1 TAPERPNT 27 (SUTURE) ×1 IMPLANT
SYR 50ML LL SCALE MARK (SYRINGE) ×3 IMPLANT
SYR CONTROL 10ML LL (SYRINGE) ×3 IMPLANT
TOWEL OR 17X24 6PK STRL BLUE (TOWEL DISPOSABLE) ×3 IMPLANT
TOWEL OR 17X26 10 PK STRL BLUE (TOWEL DISPOSABLE) ×3 IMPLANT
WATER STERILE IRR 1000ML POUR (IV SOLUTION) ×2 IMPLANT

## 2013-03-20 NOTE — Discharge Summary (Addendum)
. Patient ID: Melissa Wilkinson MRN: [id number] DOB/AGE: 1946-02-03 68 y.o.  Admit date: 03/20/2013 Discharge date: 03/21/2013  Admission Diagnoses:  Active Problems:   DJD (degenerative joint disease) of knee   Discharge Diagnoses:  Same  Past Medical History  Diagnosis Date  . Hypertension   . Arthritis   . Wears glasses   . Depression   . Anxiety   . GERD (gastroesophageal reflux disease)   . Seasonal allergies   . Complication of anesthesia     difficult waking after c section  . Blood dyscrasia     family history of c protien deficiency     Surgeries: Procedure(s): RIGHT TOTAL KNEE ARTHROPLASTY on 03/20/2013   Consultants:    Discharged Condition: Improved  Hospital Course: REGENIA ERCK is an 68 y.o. female who was admitted 03/20/2013 for operative treatment of<principal problem not specified>. Patient has severe unremitting pain that affects sleep, daily activities, and work/hobbies. After pre-op clearance the patient was taken to the operating room on 03/20/2013 and underwent  Procedure(s): RIGHT TOTAL KNEE ARTHROPLASTY.    Patient was given perioperative antibiotics:     Anti-infectives   Start     Dose/Rate Route Frequency Ordered Stop   03/21/13 1000  valACYclovir (VALTREX) tablet 2,000 mg     2,000 mg Oral 2 times daily 03/21/13 0651 03/22/13 0959   03/21/13 0000  VALTREX 500 MG tablet     500 mg Oral Daily 03/21/13 0652     03/20/13 1745  ceFAZolin (ANCEF) IVPB 1 g/50 mL premix     1 g 100 mL/hr over 30 Minutes Intravenous Every 6 hours 03/20/13 1706 03/21/13 0544   03/20/13 0600  ceFAZolin (ANCEF) IVPB 2 g/50 mL premix     2 g 100 mL/hr over 30 Minutes Intravenous On call to O.R. 03/19/13 1423 03/20/13 1037       Patient was given sequential compression devices, early ambulation, and chemoprophylaxis to prevent DVT.  Patient benefited maximally from hospital stay and there were no complications.    Recent vital signs:  Patient Vitals for  the past 24 hrs:  BP Temp Temp src Pulse Resp SpO2  03/21/13 0622 106/52 mmHg 98.4 F (36.9 C) Oral 94 18 95 %  03/21/13 0400 - - - - 18 92 %  03/21/13 0156 136/62 mmHg 98 F (36.7 C) Oral 96 18 98 %  03/21/13 0000 - - - - 20 98 %  03/20/13 2139 112/67 mmHg 98.3 F (36.8 C) Oral 97 19 99 %  03/20/13 2000 - - - - 20 98 %  03/20/13 1641 - 97.1 F (36.2 C) - - - -  03/20/13 1639 - - - 100 16 100 %     Recent laboratory studies:   Recent Labs  03/21/13 0410  WBC 9.8  HGB 10.6*  HCT 31.9*  PLT 313  NA 135*  K 4.7  CL 96  CO2 23  BUN 20  CREATININE 0.93  GLUCOSE 169*  CALCIUM 8.7     Discharge Medications:     Medication List         ALPRAZolam 1 MG tablet  Commonly known as:  XANAX  Take 0.5-1 mg by mouth 3 (three) times daily as needed for anxiety or sleep.     aspirin EC 325 MG tablet  Take 1 tablet (325 mg total) by mouth daily.     aspirin 325 MG EC tablet  Take 1 tablet (325 mg total) by mouth daily with breakfast.  DULoxetine 60 MG capsule  Commonly known as:  CYMBALTA  Take 60 mg by mouth every evening.     fluticasone 50 MCG/ACT nasal spray  Commonly known as:  FLONASE  Place 2 sprays into the nose daily as needed for allergies. For allergy symptoms     Fluticasone-Salmeterol 250-50 MCG/DOSE Aepb  Commonly known as:  ADVAIR  Inhale 1 puff into the lungs daily as needed (asthma).     ketotifen 0.025 % ophthalmic solution  Commonly known as:  ZADITOR  Place 2 drops into both eyes daily as needed (allergies).     methocarbamol 500 MG tablet  Commonly known as:  ROBAXIN  Take 1 tablet (500 mg total) by mouth every 6 (six) hours as needed for muscle spasms.     olmesartan-hydrochlorothiazide 40-25 MG per tablet  Commonly known as:  BENICAR HCT  Take 1 tablet by mouth daily.     omeprazole 20 MG capsule  Commonly known as:  PRILOSEC  Take 20 mg by mouth daily.     ondansetron 4 MG tablet  Commonly known as:  ZOFRAN  Take 1 tablet (4 mg  total) by mouth every 8 (eight) hours as needed for nausea or vomiting.     oxyCODONE-acetaminophen 5-325 MG per tablet  Commonly known as:  ROXICET  Take 1-2 tablets by mouth every 4 (four) hours as needed for severe pain.     oxyCODONE-acetaminophen 5-325 MG per tablet  Commonly known as:  PERCOCET/ROXICET  Take 1-2 tablets by mouth every 4 (four) hours as needed for severe pain.     VALTREX 500 MG tablet  Generic drug:  valACYclovir  Take 1 tablet (500 mg total) by mouth daily.        Diagnostic Studies: Dg Chest 2 View  03/14/2013   CLINICAL DATA:  Preop knee surgery  EXAM: CHEST  2 VIEW  COMPARISON:  12/03/2008  FINDINGS: The heart size and mediastinal contours are within normal limits. Both lungs are clear. The visualized skeletal structures are unremarkable.  IMPRESSION: No active cardiopulmonary disease.   Electronically Signed   By: Franchot Gallo M.D.   On: 03/14/2013 15:39   Dg Knee Right Port  03/20/2013   CLINICAL DATA:  Postop right knee  EXAM: PORTABLE RIGHT KNEE - 1-2 VIEW  COMPARISON:  None.  FINDINGS: New right knee prosthetic components are well-seated and aligned. There is no acute fracture or evidence of an operative complication.  IMPRESSION: Well-positioned right knee prosthesis.   Electronically Signed   By: Lajean Manes M.D.   On: 03/20/2013 14:43    Disposition: 01-Home or Self Care  Discharge Orders   Future Orders Complete By Expires   Call MD / Call 911  As directed    Comments:     If you experience chest pain or shortness of breath, CALL 911 and be transported to the hospital emergency room.  If you develope a fever above 101 F, pus (white drainage) or increased drainage or redness at the wound, or calf pain, call your surgeon's office.   Change dressing  As directed    Comments:     Change dressing on Sunday, then change the dressing daily with sterile 4 x 4 inch gauze dressing and apply TED hose.  You may clean the incision with alcohol prior to  redressing.   Change dressing  As directed    Comments:     Change the dressing daily with sterile 4 x 4 inch gauze dressing and paper  tape.  You may clean the incision with alcohol prior to redressing   Constipation Prevention  As directed    Comments:     Drink plenty of fluids.  Prune juice may be helpful.  You may use a stool softener, such as Colace (over the counter) 100 mg twice a day.  Use MiraLax (over the counter) for constipation as needed.   CPM  As directed    Comments:     Continuous passive motion machine (CPM):      Use the CPM from 0- to 60 for 6 hours per day.      You may increase by 10 per day.  You may break it up into 2 or 3 sessions per day.      Use CPM for 3-4 weeks or until you are told to stop.   Diet - low sodium heart healthy  As directed    Discharge instructions  As directed    Comments:     Weight bearing as tolerated.  May change dressing on Sunday.  May shower on Monday, but do not soak incision.  May ice for up to 20 minutes at a time for pain and swelling.  Follow up appointment in our office in two weeks.   Do not put a pillow under the knee. Place it under the heel.  As directed    Comments:     Place gray foam under operative heel when in bed or in a chair to work on extension   Follow the hip precautions as taught in Physical Therapy  As directed    Comments:     Posterior total hip precautions.  Weight bearing as tolerated   Increase activity slowly as tolerated  As directed    TED hose  As directed    Comments:     Use stockings (TED hose) for 2 weeks on both leg(s).  You may remove them at night for sleeping.   TED hose  As directed    Comments:     Use stockings (TED hose) for 4 weeks on both leg(s).  You may remove them at night for sleeping.      Follow-up Information   Follow up with [facility] F, MD. Schedule an appointment as soon as possible for a visit in 2 weeks.   Specialty:  Orthopedic Surgery   Contact information:   [place] [address] [phone]        Signed: Larae Grooms 03/21/2013, 3:45 PM

## 2013-03-20 NOTE — Anesthesia Preprocedure Evaluation (Signed)
Anesthesia Evaluation  Patient identified by MRN, date of birth, ID band Patient awake    Reviewed: Allergy & Precautions, H&P , NPO status , Patient's Chart, lab work & pertinent test results, reviewed documented beta blocker date and time   Airway Mallampati: II TM Distance: >3 FB Neck ROM: Full    Dental  (+) Teeth Intact, Dental Advisory Given and Caps   Pulmonary          Cardiovascular hypertension, Pt. on medications     Neuro/Psych Anxiety Depression    GI/Hepatic GERD-  Medicated and Controlled,  Endo/Other    Renal/GU      Musculoskeletal   Abdominal   Peds  Hematology   Anesthesia Other Findings   Reproductive/Obstetrics                           Anesthesia Physical Anesthesia Plan  ASA: II  Anesthesia Plan: General   Post-op Pain Management:    Induction: Intravenous  Airway Management Planned: Oral ETT  Additional Equipment:   Intra-op Plan:   Post-operative Plan: Extubation in OR  Informed Consent: I have reviewed the patients History and Physical, chart, labs and discussed the procedure including the risks, benefits and alternatives for the proposed anesthesia with the patient or authorized representative who has indicated his/her understanding and acceptance.   Dental advisory given  Plan Discussed with: Anesthesiologist and Surgeon  Anesthesia Plan Comments:         Anesthesia Quick Evaluation

## 2013-03-20 NOTE — Interval H&P Note (Signed)
History and Physical Interval Note:  03/20/2013 8:18 AM  Melissa Wilkinson  has presented today for surgery, with the diagnosis of DJD RIGHT KNEE  The various methods of treatment have been discussed with the patient and family. After consideration of risks, benefits and other options for treatment, the patient has consented to  Procedure(s): RIGHT TOTAL KNEE ARTHROPLASTY (Right) as a surgical intervention .  The patient's history has been reviewed, patient examined, no change in status, stable for surgery.  I have reviewed the patient's chart and labs.  Questions were answered to the patient's satisfaction.     MURPHY,DANIEL F

## 2013-03-20 NOTE — Progress Notes (Signed)
UR completed 

## 2013-03-20 NOTE — Transfer of Care (Signed)
Immediate Anesthesia Transfer of Care Note  Patient: Melissa Wilkinson  Procedure(s) Performed: Procedure(s): RIGHT TOTAL KNEE ARTHROPLASTY (Right)  Patient Location: PACU  Anesthesia Type:General  Level of Consciousness: awake, alert , oriented and patient cooperative  Airway & Oxygen Therapy: Patient Spontanous Breathing and Patient connected to nasal cannula oxygen  Post-op Assessment: Report given to PACU RN, Post -op Vital signs reviewed and stable and Patient moving all extremities  Post vital signs: Reviewed and stable  Complications: No apparent anesthesia complications

## 2013-03-20 NOTE — H&P (View-Only) (Signed)
TOTAL KNEE ADMISSION H&P  Patient is being admitted for right total knee arthroplasty.  Subjective:  Chief Complaint:right knee pain.  HPI: Melissa Wilkinson, 68 y.o. female, has a history of pain and functional disability in the right knee due to arthritis and has failed non-surgical conservative treatments for greater than 12 weeks to includeNSAID's and/or analgesics, corticosteriod injections, viscosupplementation injections and activity modification.  Onset of symptoms was gradual, starting 4 years ago with gradually worsening course since that time. The patient noted prior procedures on the knee to include  arthroscopy on the right knee(s).  Patient currently rates pain in the right knee(s) at 8 out of 10 with activity. Patient has night pain, worsening of pain with activity and weight bearing, pain that interferes with activities of daily living, pain with passive range of motion, crepitus and joint swelling.  Patient has evidence of periarticular osteophytes and joint space narrowing by imaging studies. There is no active infection.  There are no active problems to display for this patient.  Past Medical History  Diagnosis Date  . Hypertension   . Arthritis   . Wears glasses   . Depression   . Anxiety   . GERD (gastroesophageal reflux disease)     Past Surgical History  Procedure Laterality Date  . Cesarean section      x2  . Colonoscopy    . Dilation and curettage of uterus    . Knee arthroscopy  1989    left  . Appendectomy    . Knee arthroscopy with medial menisectomy Right 09/13/2012    Procedure: RIGHT KNEE ARTHROSCOPY WITH PARTIAL MEDIAL LATERAL MENISECTOMY CHRONDROPLASTY;  Surgeon: Daniel F Murphy, MD;  Location: [facility];  Service: Orthopedics;  Laterality: Right;     (Not in a hospital admission) Allergies  Allergen Reactions  . Hydrocodone Nausea And Vomiting  . Morphine And Related Hives and Other (See Comments)    Fever     History   Substance Use Topics  . Smoking status: Never Smoker   . Smokeless tobacco: Not on file  . Alcohol Use: Yes     Comment: daily to occ    No family history on file.   Review of Systems  Constitutional: Negative.   HENT: Negative.   Eyes: Negative.   Respiratory: Negative.   Cardiovascular: Negative.   Gastrointestinal: Negative.   Genitourinary: Negative.   Musculoskeletal: Positive for joint pain.  Skin: Negative.   Neurological: Negative.   Endo/Heme/Allergies: Negative.   Psychiatric/Behavioral: Negative.     Objective:  Physical Exam  Constitutional: She is oriented to person, place, and time. She appears well-developed and well-nourished.  HENT:  Head: Normocephalic and atraumatic.  Eyes: EOM are normal. Pupils are equal, round, and reactive to light.  Neck: Neck supple.  Cardiovascular: Normal rate and regular rhythm.  Exam reveals no gallop and no friction rub.   No murmur heard. Respiratory: Effort normal and breath sounds normal.  GI: Soft. Bowel sounds are normal.  Musculoskeletal:  Trace effusion.  She has fairly good motion.  She has good stability.  There is grating, crepitus and narrowing laterally with a little increased valgus.  No evidence of infection or DVT.    Neurological: She is alert and oriented to person, place, and time.  Skin: Skin is warm and dry.  Psychiatric: She has a normal mood and affect. Her behavior is normal. Judgment and thought content normal.    Vital signs in last 24 hours: @VSRANGES@  Labs:     Estimated body mass index is 34.40 kg/(m^2) as calculated from the following:   Height as of 09/13/12: 5' (1.524 m).   Weight as of 09/13/12: 79.89 kg (176 lb 2 oz).   Imaging Review Plain radiographs demonstrate severe degenerative joint disease of the right knee(s). The overall alignment ismild valgus. The bone quality appears to be fair for age and reported activity level.  Assessment/Plan:  End stage arthritis, right knee    The patient history, physical examination, clinical judgment of the provider and imaging studies are consistent with end stage degenerative joint disease of the right knee(s) and total knee arthroplasty is deemed medically necessary. The treatment options including medical management, injection therapy arthroscopy and arthroplasty were discussed at length. The risks and benefits of total knee arthroplasty were presented and reviewed. The risks due to aseptic loosening, infection, stiffness, patella tracking problems, thromboembolic complications and other imponderables were discussed. The patient acknowledged the explanation, agreed to proceed with the plan and consent was signed. Patient is being admitted for inpatient treatment for surgery, pain control, PT, OT, prophylactic antibiotics, VTE prophylaxis, progressive ambulation and ADL's and discharge planning. The patient is planning to be discharged home with home health services

## 2013-03-20 NOTE — Progress Notes (Signed)
Orthopedic Tech Progress Note Patient Details:  Melissa Wilkinson 1945/03/17 [id number]  CPM Right Knee CPM Right Knee: On Right Knee Flexion (Degrees): 60 Right Knee Extension (Degrees): 0 Additional Comments: Trapeze bar   Cammer, Theodoro Parma 03/20/2013, 12:57 PM

## 2013-03-20 NOTE — Anesthesia Postprocedure Evaluation (Signed)
  Anesthesia Post-op Note  Patient: Melissa Wilkinson  Procedure(s) Performed: Procedure(s): RIGHT TOTAL KNEE ARTHROPLASTY (Right)  Patient Location: PACU  Anesthesia Type:General  Level of Consciousness: awake, alert  and oriented  Airway and Oxygen Therapy: Patient Spontanous Breathing  Post-op Pain: mild  Post-op Assessment: Post-op Vital signs reviewed, Patient's Cardiovascular Status Stable, Respiratory Function Stable, Patent Airway, No signs of Nausea or vomiting and Pain level controlled  Post-op Vital Signs: Reviewed and stable  Complications: No apparent anesthesia complications

## 2013-03-20 NOTE — Preoperative (Signed)
Beta Blockers   Reason not to administer Beta Blockers:Not Applicable 

## 2013-03-21 ENCOUNTER — Encounter (HOSPITAL_COMMUNITY): Payer: Self-pay | Admitting: General Practice

## 2013-03-21 LAB — CBC
HEMATOCRIT: 31.9 % — AB (ref 36.0–46.0)
Hemoglobin: 10.6 g/dL — ABNORMAL LOW (ref 12.0–15.0)
MCH: 31.2 pg (ref 26.0–34.0)
MCHC: 33.2 g/dL (ref 30.0–36.0)
MCV: 93.8 fL (ref 78.0–100.0)
PLATELETS: 313 10*3/uL (ref 150–400)
RBC: 3.4 MIL/uL — ABNORMAL LOW (ref 3.87–5.11)
RDW: 12.6 % (ref 11.5–15.5)
WBC: 9.8 10*3/uL (ref 4.0–10.5)

## 2013-03-21 LAB — BASIC METABOLIC PANEL
BUN: 20 mg/dL (ref 6–23)
CO2: 23 mEq/L (ref 19–32)
CREATININE: 0.93 mg/dL (ref 0.50–1.10)
Calcium: 8.7 mg/dL (ref 8.4–10.5)
Chloride: 96 mEq/L (ref 96–112)
GFR, EST AFRICAN AMERICAN: 73 mL/min — AB (ref >90)
GFR, EST NON AFRICAN AMERICAN: 63 mL/min — AB (ref >90)
Glucose, Bld: 169 mg/dL — ABNORMAL HIGH (ref 70–99)
Potassium: 4.7 mEq/L (ref 3.7–5.3)
Sodium: 135 mEq/L — ABNORMAL LOW (ref 137–147)

## 2013-03-21 MED ORDER — VALTREX 500 MG PO TABS: 500.0000 mg | ORAL_TABLET | Freq: Every day | ORAL | Status: AC

## 2013-03-21 MED ORDER — ASPIRIN 325 MG PO TBEC: 325.0000 mg | DELAYED_RELEASE_TABLET | Freq: Every day | ORAL | Status: DC

## 2013-03-21 MED ORDER — VALACYCLOVIR HCL 500 MG PO TABS
2000.0000 mg | ORAL_TABLET | Freq: Two times a day (BID) | ORAL | Status: DC
  Filled 2013-03-21 (×2): qty 4

## 2013-03-21 MED ORDER — OXYCODONE-ACETAMINOPHEN 5-325 MG PO TABS: 1.0000 | ORAL_TABLET | ORAL | Status: AC | PRN

## 2013-03-21 NOTE — Plan of Care (Signed)
Problem: Consults Goal: Diagnosis- Total Joint Replacement Outcome: Completed/Met Date Met:  03/21/13 Primary Total Knee

## 2013-03-21 NOTE — Op Note (Signed)
NAMEAYN, DOMANGUE NO.:  [id number]  MEDICAL RECORD NO.:  [id number]  LOCATION:  5N02C                        FACILITY:  [facility]  PHYSICIAN:  Ninetta Lights, M.D. 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  PROCEDURE:  Right knee modified minimally invasive total knee replacement, Stryker triathlon prosthesis.  Soft tissue balancing. Cemented pegged posterior stabilized #3 femoral component.  Cemented #3 tibial component, 9-mm polyethylene insert.  Cemented resurfacing pegged medial offset 32-mm patellar component.  SURGEON:  Ninetta Lights, MD  ASSISTANT:  Doran Stabler, PA present throughout the entire case, necessary for timely completion of procedure.  ANESTHESIA:  General.  BLOOD LOSS:  Minimal.  SPECIMENS:  None.  CULTURES:  None.  COMPLICATIONS:  None.  DRESSING:  Soft compressive, knee immobilizer.  DRAINS:  Hemovac x1.  TOURNIQUET TIME:  50 minutes.  PROCEDURE:  The patient was brought to the operating room, placed on the operating room table in supine position.  After adequate anesthesia had been obtained, right knee examined.  More than 10 degrees of valgus relatively fixed, 5 degree flexion contracture.  Reasonable flexion. Lateral patellofemoral tracking tethering.  Exsanguinated with elevation of Esmarch.  Tourniquet inflated to 350 mmHg.  Straight incision above the patella down to tibial tubercle.  Medial arthrotomy, vastus splitting, preserving quad tendon.  Elevated the medial side.  Grade 4 changes throughout.  Remnants of menisci, cruciate ligaments, periarticular spurs, loose bodies removed.  Distal femur  exposed. Intramedullary guide placed.  Flexible rod.  An 8-mm resection 5 degrees of valgus.  Soft tissue freed up laterally but I did not do an extensive release of lateral structures.  Using epicondylar axis, the femur was sized, cut, and fitted for a pegged posterior stabilized #3 component.  Proximal tibial resection below the defect with extramedullary guide.  Size #3 component.  Rotation set with trials.  The flexion and extension gap was assessed and found to be in good position.  Patella exposed.  Posterior 10 mm removed.  Drilled, sized, and fitted for a 32-mm component.  Copiously irrigated.  Knee cleared throughout.  Trials put in place.  With the 9 mm-insert very pleased with balancing by mechanical axis.  Flexion , extension, stability and patellofemoral tracking.  Tibia had been marked and then reamed.  All trials removed.  Copious irrigation with pulse irrigating device.  Cement prepared, placed on all components, firmly seated. Patella with a clamp.  Polyethylene attached to tibia.  Once the cement hardened, the knee was irrigated once again.  Soft tissues injected with Exparel.  Hemovac was placed.  Arthrotomy closed with #1 Vicryl.  Skin and subcutaneous tissue subcutaneous and subcuticular closure.  Margins were injected with Marcaine.  Sterile compressive dressing applied. Tourniquet fully removed.  Knee immobilizer applied.  Anesthesia reversed.  Brought to the recovery room.  Tolerated the surgery well. No complications.     Ninetta Lights, M.D.  DFM/MEDQ  D:  03/20/2013  T:  03/21/2013  Job:  [id number]

## 2013-03-21 NOTE — Care Management Note (Signed)
CARE MANAGEMENT NOTE 03/21/2013  Patient:  Melissa Wilkinson, Melissa Wilkinson   Account Number:  [id number]  Date Initiated:  03/21/2013  Documentation initiated by:  Ricki Miller  Subjective/Objective Assessment:   68 yr old female s/p right total knee arthroplasty     Action/Plan:   Case manager spoke with pateint concerning home health and DME needs at discharge. Choice offered. Patient has Rolling walker, CPM.   Anticipated DC Date:  03/22/2013   Anticipated DC Plan:  [facility] referral  NA      DC Planning Services  CM consult      [facility] Choice  HOME HEALTH  DURABLE MEDICAL EQUIPMENT   Choice offered to / List presented to:  C-1 Patient   DME arranged  CPM      DME agency  [employer]     HH arranged  HH-2 PT      [place] [facility].   Status of service:  Completed, signed off

## 2013-03-21 NOTE — Progress Notes (Signed)
Physical Therapy Evaluation   03/21/13 1125  PT Visit Information  Last PT Received On 03/21/13  Assistance Needed +1  History of Present Illness Pt is a 68 y/o female admitted s/p R TKA.  Precautions  Precautions Fall;Knee  Precaution Booklet Issued No  Precaution Comments Discussed towel roll under heel and NO pillow under knee.  Restrictions  Weight Bearing Restrictions Yes  RLE Weight Bearing WBAT  Home Living  Family/patient expects to be discharged to: Private residence  Living Arrangements Spouse/significant other  Available Help at Discharge Family;Available 24 hours/day  Type of Home House  Home Access Stairs to enter  Entrance Stairs-Number of Steps 1  Entrance Stairs-Rails None  Home Layout Two level;Able to live on main level with bedroom/bathroom  Home Equipment Walker - 2 wheels;BSC  Prior Function  Level of Independence Independent  Communication  Communication No difficulties  Cognition  Arousal/Alertness Awake/alert  Behavior During Therapy WFL for tasks assessed/performed  Overall Cognitive Status Within Functional Limits for tasks assessed  Upper Extremity Assessment  Upper Extremity Assessment Defer to OT evaluation  Lower Extremity Assessment  Lower Extremity Assessment RLE deficits/detail  RLE Deficits / Details Decreased strength and AROM consistent with TKA  RLE Unable to fully assess due to pain  Cervical / Trunk Assessment  Cervical / Trunk Assessment Normal  Bed Mobility  Overal bed mobility Needs Assistance  Bed Mobility Supine to Sit  Supine to sit Supervision  General bed mobility comments VC's for sequencing and technique.   Transfers  Overall transfer level Needs assistance  Equipment used Rolling walker (2 wheeled)  Transfers Sit to/from Stand  Sit to Stand Supervision  General transfer comment VC's for hand placement on seated surface. No physical assist required.   Ambulation/Gait  Ambulation/Gait assistance Supervision   Ambulation Distance (Feet) 100 Feet  Assistive device Rolling walker (2 wheeled)  Gait Pattern/deviations Step-through pattern;Decreased stride length  Gait velocity Decreased  Gait velocity interpretation Below normal speed for age/gender  General Gait Details VC's for sequencing and safety awareness. No physical assist required.   Stairs Yes  Stairs assistance Min guard  Stair Management No rails;Forwards;With walker  Number of Stairs 1  General stair comments Practiced small curb step to simulate entrance into home. No assist required.   Balance  Overall balance assessment No apparent balance deficits (not formally assessed)  Exercises  Exercises Total Joint  Total Joint Exercises  Ankle Circles/Pumps 10 reps  Quad Sets 10 reps  Towel Squeeze 10 reps  Short Arc Quad 10 reps  Heel Slides 10 reps  Hip ABduction/ADduction 10 reps  Straight Leg Raises 10 reps  Long Arc Quad 10 reps  Goniometric ROM 105 AROM  PT - End of Session  Equipment Utilized During Treatment Gait belt  Activity Tolerance Patient tolerated treatment well  Patient left in chair;with call bell/phone within reach  Nurse Communication Mobility status  CPM Right Knee  CPM Right Knee Off  PT Assessment  PT Recommendation/Assessment Patient needs continued PT services  PT Problem List Decreased strength;Decreased range of motion;Decreased activity tolerance;Decreased balance;Decreased mobility;Decreased knowledge of use of DME;Decreased safety awareness;Decreased knowledge of precautions;Pain  PT Therapy Diagnosis  Difficulty walking;Acute pain  PT Plan  PT Frequency 7X/week  PT Treatment/Interventions DME instruction;Gait training;Stair training;Functional mobility training;Therapeutic activities;Therapeutic exercise;Neuromuscular re-education;Patient/family education  PT Recommendation  Follow Up Recommendations Home health PT  PT equipment None recommended by PT  Individuals Consulted  Consulted and  Agree with Results and Recommendations Patient  Acute Rehab PT  Goals  Patient Stated Goal To be able to walk "normal" again.  PT Goal Formulation No goals set, d/c therapy  Time For Goal Achievement 04/04/13  Potential to Achieve Goals Good  PT Time Calculation  PT Start Time 1125  PT Stop Time 1210  PT Time Calculation (min) 45 min  PT General Charges  $$ ACUTE PT VISIT 1 Procedure  PT Evaluation  $Initial PT Evaluation Tier I 1 Procedure  PT Treatments  $Gait Training 8-22 mins  $Therapeutic Exercise 23-37 mins  Written Expression  Dominant Hand Right  PT assessment: Patient evaluated by Physical Therapy with no further acute PT needs identified. All education has been completed and the patient has no further questions. See below for any follow-up Physial Therapy or equipment needs. Pt was able to perform transfers and ambulation with no assistance, and HEP was gone over in detail.  Spoke with PA and pt is to be discharged home today. PT is signing off. Thank you for this referral.   Jolyn Lent, PT, DPT [phone]

## 2013-03-21 NOTE — Progress Notes (Signed)
Subjective: 1 Day Post-Op Procedure(s) (LRB): RIGHT TOTAL KNEE ARTHROPLASTY (Right) Patient reports pain as 3 on 0-10 scale.  No nausea/vomiting.  Positive flatus, but no BM as of yet.  Good appetite.  Patient complaining of cold sores to her lips which started yesterday after surgery.  She says that stressful situations seem to cause these.    Objective: Vital signs in last 24 hours: Temp:  [97.1 F (36.2 C)-98.4 F (36.9 C)] 98.4 F (36.9 C) (01/29 0622) Pulse Rate:  [79-100] 94 (01/29 0622) Resp:  [9-20] 18 (01/29 0622) BP: (100-146)/(52-92) 106/52 mmHg (01/29 0622) SpO2:  [91 %-100 %] 95 % (01/29 0622)  Intake/Output from previous day: 01/28 0701 - 01/29 0700 In: 2000 [P.O.:50; I.V.:1900; IV Piggyback:50] Out: 650 [Urine:100; Drains:450; Blood:100] Intake/Output this shift: Total I/O In: 450 [I.V.:400; IV Piggyback:50] Out: 250 [Urine:100; Drains:150]   Recent Labs  03/21/13 0410  HGB 10.6*    Recent Labs  03/21/13 0410  WBC 9.8  RBC 3.40*  HCT 31.9*  PLT 313    Recent Labs  03/21/13 0410  NA 135*  K 4.7  CL 96  CO2 23  BUN 20  CREATININE 0.93  GLUCOSE 169*  CALCIUM 8.7   No results found for this basename: LABPT, INR,  in the last 72 hours  Neurologically intact ABD soft Neurovascular intact Sensation intact distally Intact pulses distally Dorsiflexion/Plantar flexion intact Compartment soft No drainage noted through ace hemovac drain pulled by me today  Assessment/Plan: 1 Day Post-Op Procedure(s) (LRB): RIGHT TOTAL KNEE ARTHROPLASTY (Right) Advance diet Up with therapy D/C IV fluids Plan for discharge tomorrow Discharge home with home health Ordered Valtrex for cold sores   ANTON, M. LINDSEY 03/21/2013, 6:47 AM

## 2013-03-21 NOTE — Discharge Instructions (Signed)
Total Knee Replacement Care After Refer to this sheet in the next few weeks. These discharge instructions provide you with general information on caring for yourself after you leave the hospital. Your caregiver may also give you specific instructions. Your treatment has been planned according to the most current medical practices available, but unavoidable complications sometimes occur. If you have any problems or questions after discharge, please call your caregiver. Regaining a near full range of motion of your knee within the first 3 to 6 weeks after surgery is critical. [place] may resume a normal diet and activities as directed.  Perform exercises as directed.  Place gray foam block, curve side up under heel at all times except when in CPM or when walking.  DO NOT modify, tear, cut, or change in any way the gray foam block. You will receive physical therapy daily  Take showers instead of baths until informed otherwise.  You may shower on Sunday.  Please wash whole leg including wound with soap and water  Change bandages (dressings)daily It is OK to take over-the-counter tylenol in addition to the oxycodone for pain, discomfort, or fever. Oxycodone is VERY constipating.  Please take stool softener twice a day and laxatives daily until bowels are regular Eat a well-balanced diet.  Avoid lifting or driving until you are instructed otherwise.  Make an appointment to see your caregiver for stitches (suture) or staple removal as directed.  If you have been sent home with a continuous passive motion machine (CPM machine), 0-90 degrees 6 hrs a day   2 hrs a shift SEEK MEDICAL CARE IF: You have swelling of your calf or leg.  You develop shortness of breath or chest pain.  You have redness, swelling, or increasing pain in the wound.  There is pus or any unusual drainage coming from the surgical site.  You notice a bad smell coming from the surgical site or dressing.  The surgical  site breaks open after sutures or staples have been removed.  There is persistent bleeding from the suture or staple line.  You are getting worse or are not improving.  You have any other questions or concerns.  SEEK IMMEDIATE MEDICAL CARE IF:  You have a fever.  You develop a rash.  You have difficulty breathing.  You develop any reaction or side effects to medicines given.  Your knee motion is decreasing rather than improving.  MAKE SURE YOU:  Understand these instructions.  Will watch your condition.  Will get help right away if you are not doing well or get worse.   Home Health physical therapy to be provided by [facility] [phone]

## 2013-03-23 DIAGNOSIS — F341 Dysthymic disorder: Secondary | ICD-10-CM | POA: Diagnosis not present

## 2013-03-23 DIAGNOSIS — Z96659 Presence of unspecified artificial knee joint: Secondary | ICD-10-CM | POA: Diagnosis not present

## 2013-03-23 DIAGNOSIS — I10 Essential (primary) hypertension: Secondary | ICD-10-CM | POA: Diagnosis not present

## 2013-03-23 DIAGNOSIS — IMO0001 Reserved for inherently not codable concepts without codable children: Secondary | ICD-10-CM | POA: Diagnosis not present

## 2013-03-23 DIAGNOSIS — Z471 Aftercare following joint replacement surgery: Secondary | ICD-10-CM | POA: Diagnosis not present

## 2013-03-25 DIAGNOSIS — F341 Dysthymic disorder: Secondary | ICD-10-CM | POA: Diagnosis not present

## 2013-03-25 DIAGNOSIS — IMO0001 Reserved for inherently not codable concepts without codable children: Secondary | ICD-10-CM | POA: Diagnosis not present

## 2013-03-25 DIAGNOSIS — I10 Essential (primary) hypertension: Secondary | ICD-10-CM | POA: Diagnosis not present

## 2013-03-25 DIAGNOSIS — Z471 Aftercare following joint replacement surgery: Secondary | ICD-10-CM | POA: Diagnosis not present

## 2013-03-25 DIAGNOSIS — Z96659 Presence of unspecified artificial knee joint: Secondary | ICD-10-CM | POA: Diagnosis not present

## 2013-03-27 DIAGNOSIS — Z96659 Presence of unspecified artificial knee joint: Secondary | ICD-10-CM | POA: Diagnosis not present

## 2013-03-27 DIAGNOSIS — F341 Dysthymic disorder: Secondary | ICD-10-CM | POA: Diagnosis not present

## 2013-03-27 DIAGNOSIS — I10 Essential (primary) hypertension: Secondary | ICD-10-CM | POA: Diagnosis not present

## 2013-03-27 DIAGNOSIS — Z471 Aftercare following joint replacement surgery: Secondary | ICD-10-CM | POA: Diagnosis not present

## 2013-03-27 DIAGNOSIS — IMO0001 Reserved for inherently not codable concepts without codable children: Secondary | ICD-10-CM | POA: Diagnosis not present

## 2013-03-29 DIAGNOSIS — F341 Dysthymic disorder: Secondary | ICD-10-CM | POA: Diagnosis not present

## 2013-03-29 DIAGNOSIS — I10 Essential (primary) hypertension: Secondary | ICD-10-CM | POA: Diagnosis not present

## 2013-03-29 DIAGNOSIS — IMO0001 Reserved for inherently not codable concepts without codable children: Secondary | ICD-10-CM | POA: Diagnosis not present

## 2013-03-29 DIAGNOSIS — Z96659 Presence of unspecified artificial knee joint: Secondary | ICD-10-CM | POA: Diagnosis not present

## 2013-03-29 DIAGNOSIS — Z471 Aftercare following joint replacement surgery: Secondary | ICD-10-CM | POA: Diagnosis not present

## 2013-04-01 DIAGNOSIS — Z96659 Presence of unspecified artificial knee joint: Secondary | ICD-10-CM | POA: Diagnosis not present

## 2013-04-01 DIAGNOSIS — I10 Essential (primary) hypertension: Secondary | ICD-10-CM | POA: Diagnosis not present

## 2013-04-01 DIAGNOSIS — F341 Dysthymic disorder: Secondary | ICD-10-CM | POA: Diagnosis not present

## 2013-04-01 DIAGNOSIS — IMO0001 Reserved for inherently not codable concepts without codable children: Secondary | ICD-10-CM | POA: Diagnosis not present

## 2013-04-01 DIAGNOSIS — Z471 Aftercare following joint replacement surgery: Secondary | ICD-10-CM | POA: Diagnosis not present

## 2013-04-05 DIAGNOSIS — M1710 Unilateral primary osteoarthritis, unspecified knee: Secondary | ICD-10-CM | POA: Diagnosis not present

## 2013-04-06 DIAGNOSIS — M1710 Unilateral primary osteoarthritis, unspecified knee: Secondary | ICD-10-CM | POA: Diagnosis not present

## 2013-04-08 DIAGNOSIS — M1710 Unilateral primary osteoarthritis, unspecified knee: Secondary | ICD-10-CM | POA: Diagnosis not present

## 2013-04-10 DIAGNOSIS — M1710 Unilateral primary osteoarthritis, unspecified knee: Secondary | ICD-10-CM | POA: Diagnosis not present

## 2013-04-12 DIAGNOSIS — M1710 Unilateral primary osteoarthritis, unspecified knee: Secondary | ICD-10-CM | POA: Diagnosis not present

## 2013-04-15 DIAGNOSIS — M1710 Unilateral primary osteoarthritis, unspecified knee: Secondary | ICD-10-CM | POA: Diagnosis not present

## 2013-04-17 DIAGNOSIS — M1710 Unilateral primary osteoarthritis, unspecified knee: Secondary | ICD-10-CM | POA: Diagnosis not present

## 2013-04-19 DIAGNOSIS — M1710 Unilateral primary osteoarthritis, unspecified knee: Secondary | ICD-10-CM | POA: Diagnosis not present

## 2013-04-22 DIAGNOSIS — M1710 Unilateral primary osteoarthritis, unspecified knee: Secondary | ICD-10-CM | POA: Diagnosis not present

## 2013-04-24 DIAGNOSIS — M1710 Unilateral primary osteoarthritis, unspecified knee: Secondary | ICD-10-CM | POA: Diagnosis not present

## 2013-04-26 DIAGNOSIS — M1710 Unilateral primary osteoarthritis, unspecified knee: Secondary | ICD-10-CM | POA: Diagnosis not present

## 2013-04-29 DIAGNOSIS — M1710 Unilateral primary osteoarthritis, unspecified knee: Secondary | ICD-10-CM | POA: Diagnosis not present

## 2013-04-30 DIAGNOSIS — Z96659 Presence of unspecified artificial knee joint: Secondary | ICD-10-CM | POA: Diagnosis not present

## 2013-04-30 DIAGNOSIS — Z471 Aftercare following joint replacement surgery: Secondary | ICD-10-CM | POA: Diagnosis not present

## 2013-05-01 DIAGNOSIS — M1710 Unilateral primary osteoarthritis, unspecified knee: Secondary | ICD-10-CM | POA: Diagnosis not present

## 2013-05-03 DIAGNOSIS — M1710 Unilateral primary osteoarthritis, unspecified knee: Secondary | ICD-10-CM | POA: Diagnosis not present

## 2013-05-07 DIAGNOSIS — IMO0002 Reserved for concepts with insufficient information to code with codable children: Secondary | ICD-10-CM | POA: Diagnosis not present

## 2013-06-04 DIAGNOSIS — F411 Generalized anxiety disorder: Secondary | ICD-10-CM | POA: Diagnosis not present

## 2013-06-04 DIAGNOSIS — R300 Dysuria: Secondary | ICD-10-CM | POA: Diagnosis not present

## 2013-06-11 DIAGNOSIS — Z96659 Presence of unspecified artificial knee joint: Secondary | ICD-10-CM | POA: Diagnosis not present

## 2013-06-11 DIAGNOSIS — Z471 Aftercare following joint replacement surgery: Secondary | ICD-10-CM | POA: Diagnosis not present

## 2013-06-21 DIAGNOSIS — F411 Generalized anxiety disorder: Secondary | ICD-10-CM | POA: Diagnosis not present

## 2013-06-21 DIAGNOSIS — F329 Major depressive disorder, single episode, unspecified: Secondary | ICD-10-CM | POA: Diagnosis not present

## 2013-06-21 DIAGNOSIS — K219 Gastro-esophageal reflux disease without esophagitis: Secondary | ICD-10-CM | POA: Diagnosis not present

## 2013-06-21 DIAGNOSIS — I10 Essential (primary) hypertension: Secondary | ICD-10-CM | POA: Diagnosis not present

## 2013-06-21 DIAGNOSIS — F3289 Other specified depressive episodes: Secondary | ICD-10-CM | POA: Diagnosis not present

## 2013-06-21 DIAGNOSIS — B009 Herpesviral infection, unspecified: Secondary | ICD-10-CM | POA: Diagnosis not present

## 2013-06-21 DIAGNOSIS — L293 Anogenital pruritus, unspecified: Secondary | ICD-10-CM | POA: Diagnosis not present

## 2013-08-01 DIAGNOSIS — I10 Essential (primary) hypertension: Secondary | ICD-10-CM | POA: Diagnosis not present

## 2013-09-16 DIAGNOSIS — S0180XA Unspecified open wound of other part of head, initial encounter: Secondary | ICD-10-CM | POA: Diagnosis not present

## 2013-09-20 DIAGNOSIS — S0180XA Unspecified open wound of other part of head, initial encounter: Secondary | ICD-10-CM | POA: Diagnosis not present

## 2013-12-17 DIAGNOSIS — Z96651 Presence of right artificial knee joint: Secondary | ICD-10-CM | POA: Diagnosis not present

## 2014-01-07 DIAGNOSIS — J309 Allergic rhinitis, unspecified: Secondary | ICD-10-CM | POA: Diagnosis not present

## 2014-01-07 DIAGNOSIS — I10 Essential (primary) hypertension: Secondary | ICD-10-CM | POA: Diagnosis not present

## 2014-01-07 DIAGNOSIS — F419 Anxiety disorder, unspecified: Secondary | ICD-10-CM | POA: Diagnosis not present

## 2014-01-07 DIAGNOSIS — Z23 Encounter for immunization: Secondary | ICD-10-CM | POA: Diagnosis not present

## 2014-02-25 DIAGNOSIS — Z1231 Encounter for screening mammogram for malignant neoplasm of breast: Secondary | ICD-10-CM | POA: Diagnosis not present

## 2014-04-24 DIAGNOSIS — J329 Chronic sinusitis, unspecified: Secondary | ICD-10-CM | POA: Diagnosis not present

## 2014-06-09 DIAGNOSIS — W19XXXA Unspecified fall, initial encounter: Secondary | ICD-10-CM | POA: Diagnosis not present

## 2014-06-09 DIAGNOSIS — R2689 Other abnormalities of gait and mobility: Secondary | ICD-10-CM | POA: Diagnosis not present

## 2014-06-09 DIAGNOSIS — T424X5A Adverse effect of benzodiazepines, initial encounter: Secondary | ICD-10-CM | POA: Diagnosis not present

## 2014-07-09 DIAGNOSIS — I10 Essential (primary) hypertension: Secondary | ICD-10-CM | POA: Diagnosis not present

## 2014-07-09 DIAGNOSIS — K219 Gastro-esophageal reflux disease without esophagitis: Secondary | ICD-10-CM | POA: Diagnosis not present

## 2014-07-09 DIAGNOSIS — J309 Allergic rhinitis, unspecified: Secondary | ICD-10-CM | POA: Diagnosis not present

## 2014-07-09 DIAGNOSIS — B001 Herpesviral vesicular dermatitis: Secondary | ICD-10-CM | POA: Diagnosis not present

## 2014-07-09 DIAGNOSIS — F419 Anxiety disorder, unspecified: Secondary | ICD-10-CM | POA: Diagnosis not present

## 2014-08-18 ENCOUNTER — Other Ambulatory Visit: Payer: Self-pay

## 2014-08-28 DIAGNOSIS — J209 Acute bronchitis, unspecified: Secondary | ICD-10-CM | POA: Diagnosis not present

## 2014-09-09 DIAGNOSIS — J209 Acute bronchitis, unspecified: Secondary | ICD-10-CM | POA: Diagnosis not present

## 2014-11-24 DIAGNOSIS — F419 Anxiety disorder, unspecified: Secondary | ICD-10-CM | POA: Diagnosis not present

## 2014-11-24 DIAGNOSIS — I10 Essential (primary) hypertension: Secondary | ICD-10-CM | POA: Diagnosis not present

## 2014-11-24 DIAGNOSIS — Z23 Encounter for immunization: Secondary | ICD-10-CM | POA: Diagnosis not present

## 2015-01-06 DIAGNOSIS — Z96651 Presence of right artificial knee joint: Secondary | ICD-10-CM | POA: Diagnosis not present

## 2015-02-03 DIAGNOSIS — Z96651 Presence of right artificial knee joint: Secondary | ICD-10-CM | POA: Diagnosis not present

## 2015-02-03 DIAGNOSIS — M545 Low back pain: Secondary | ICD-10-CM | POA: Diagnosis not present

## 2015-03-17 DIAGNOSIS — M545 Low back pain: Secondary | ICD-10-CM | POA: Diagnosis not present

## 2015-03-24 DIAGNOSIS — Z1231 Encounter for screening mammogram for malignant neoplasm of breast: Secondary | ICD-10-CM | POA: Diagnosis not present

## 2015-04-10 IMAGING — CR DG CHEST 2V
2 series · 2 of 2 positions shown · non-contrast
Comparison: 12/03/2008

CLINICAL DATA: Preop knee surgery

EXAM:
CHEST  2 VIEW

[w chest pa]
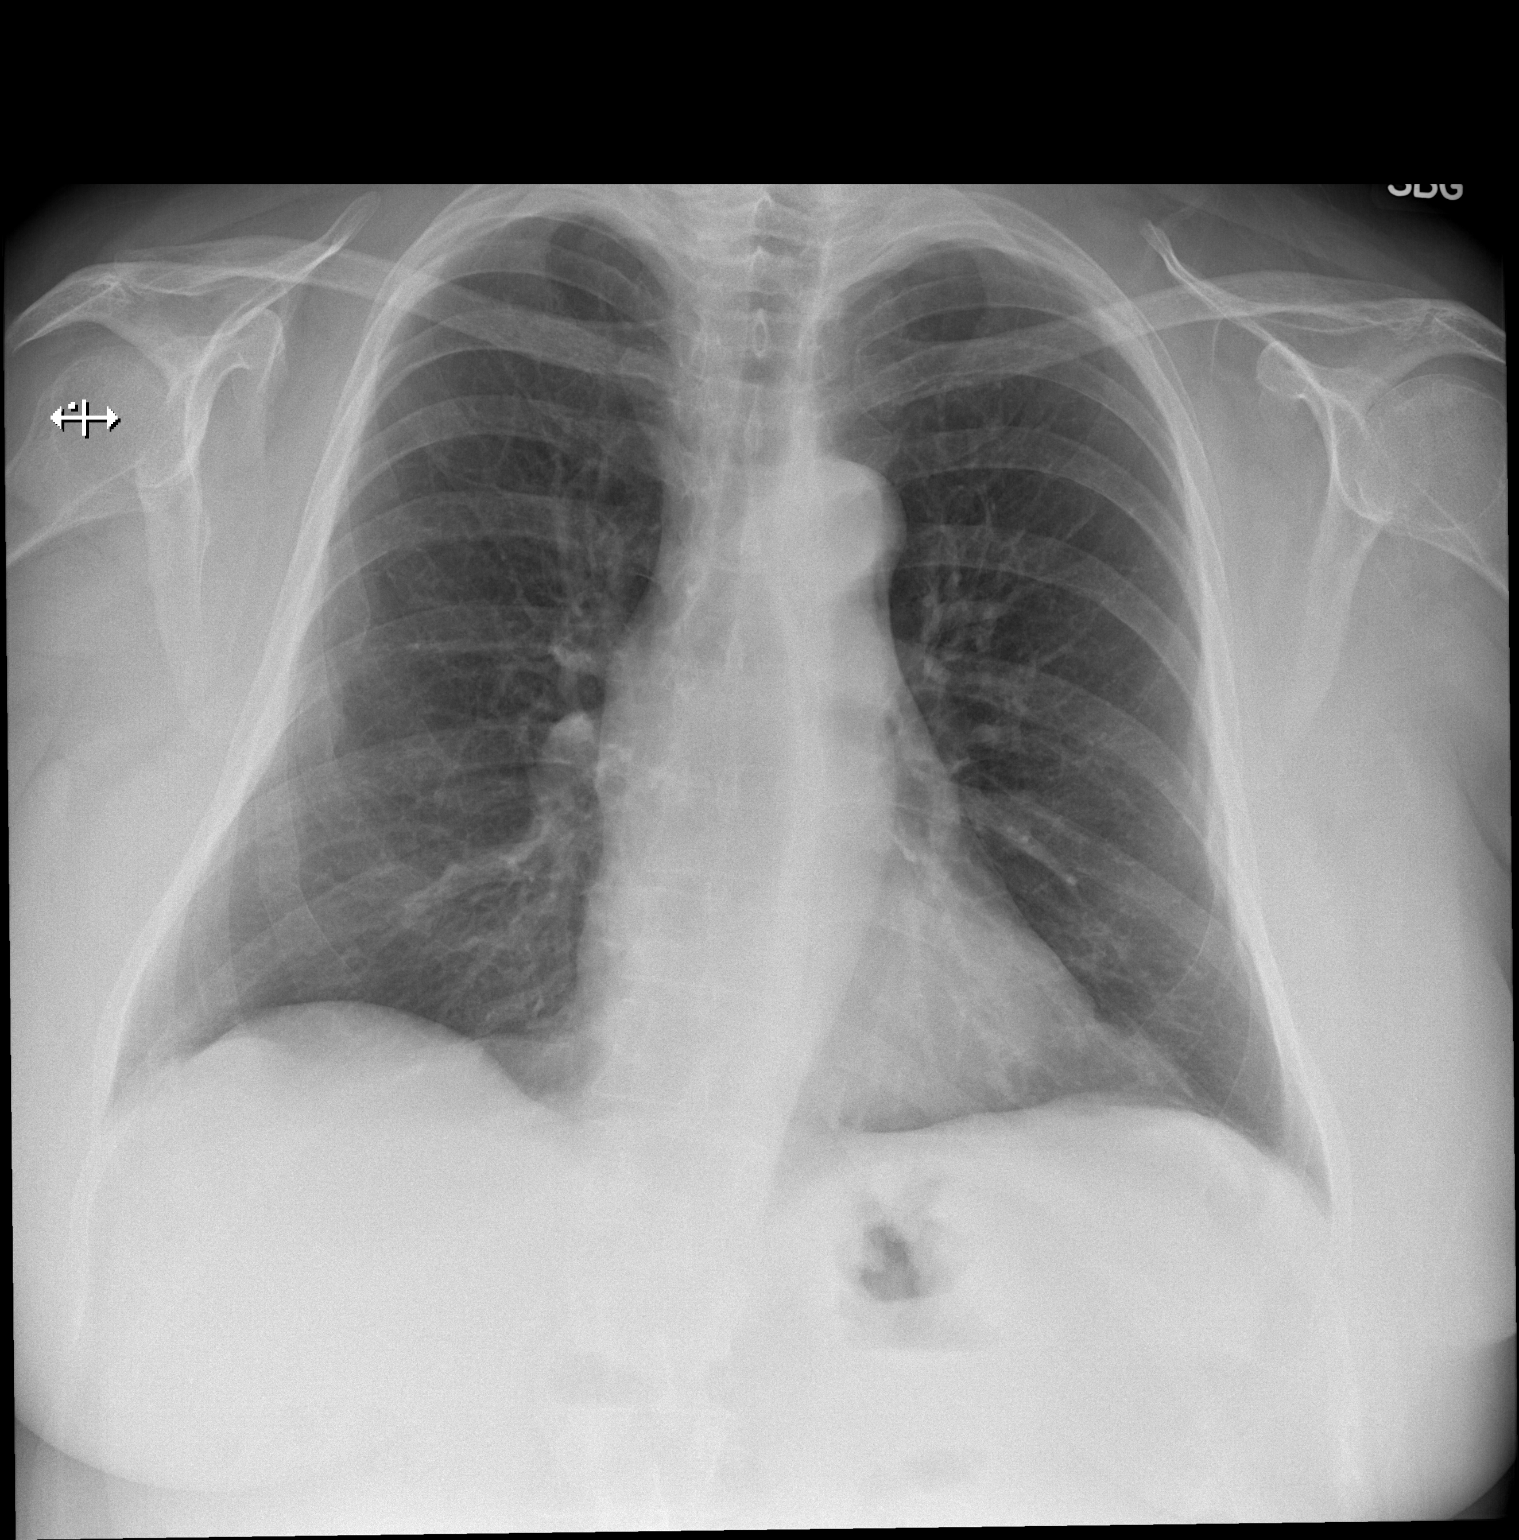

[w chest lat]
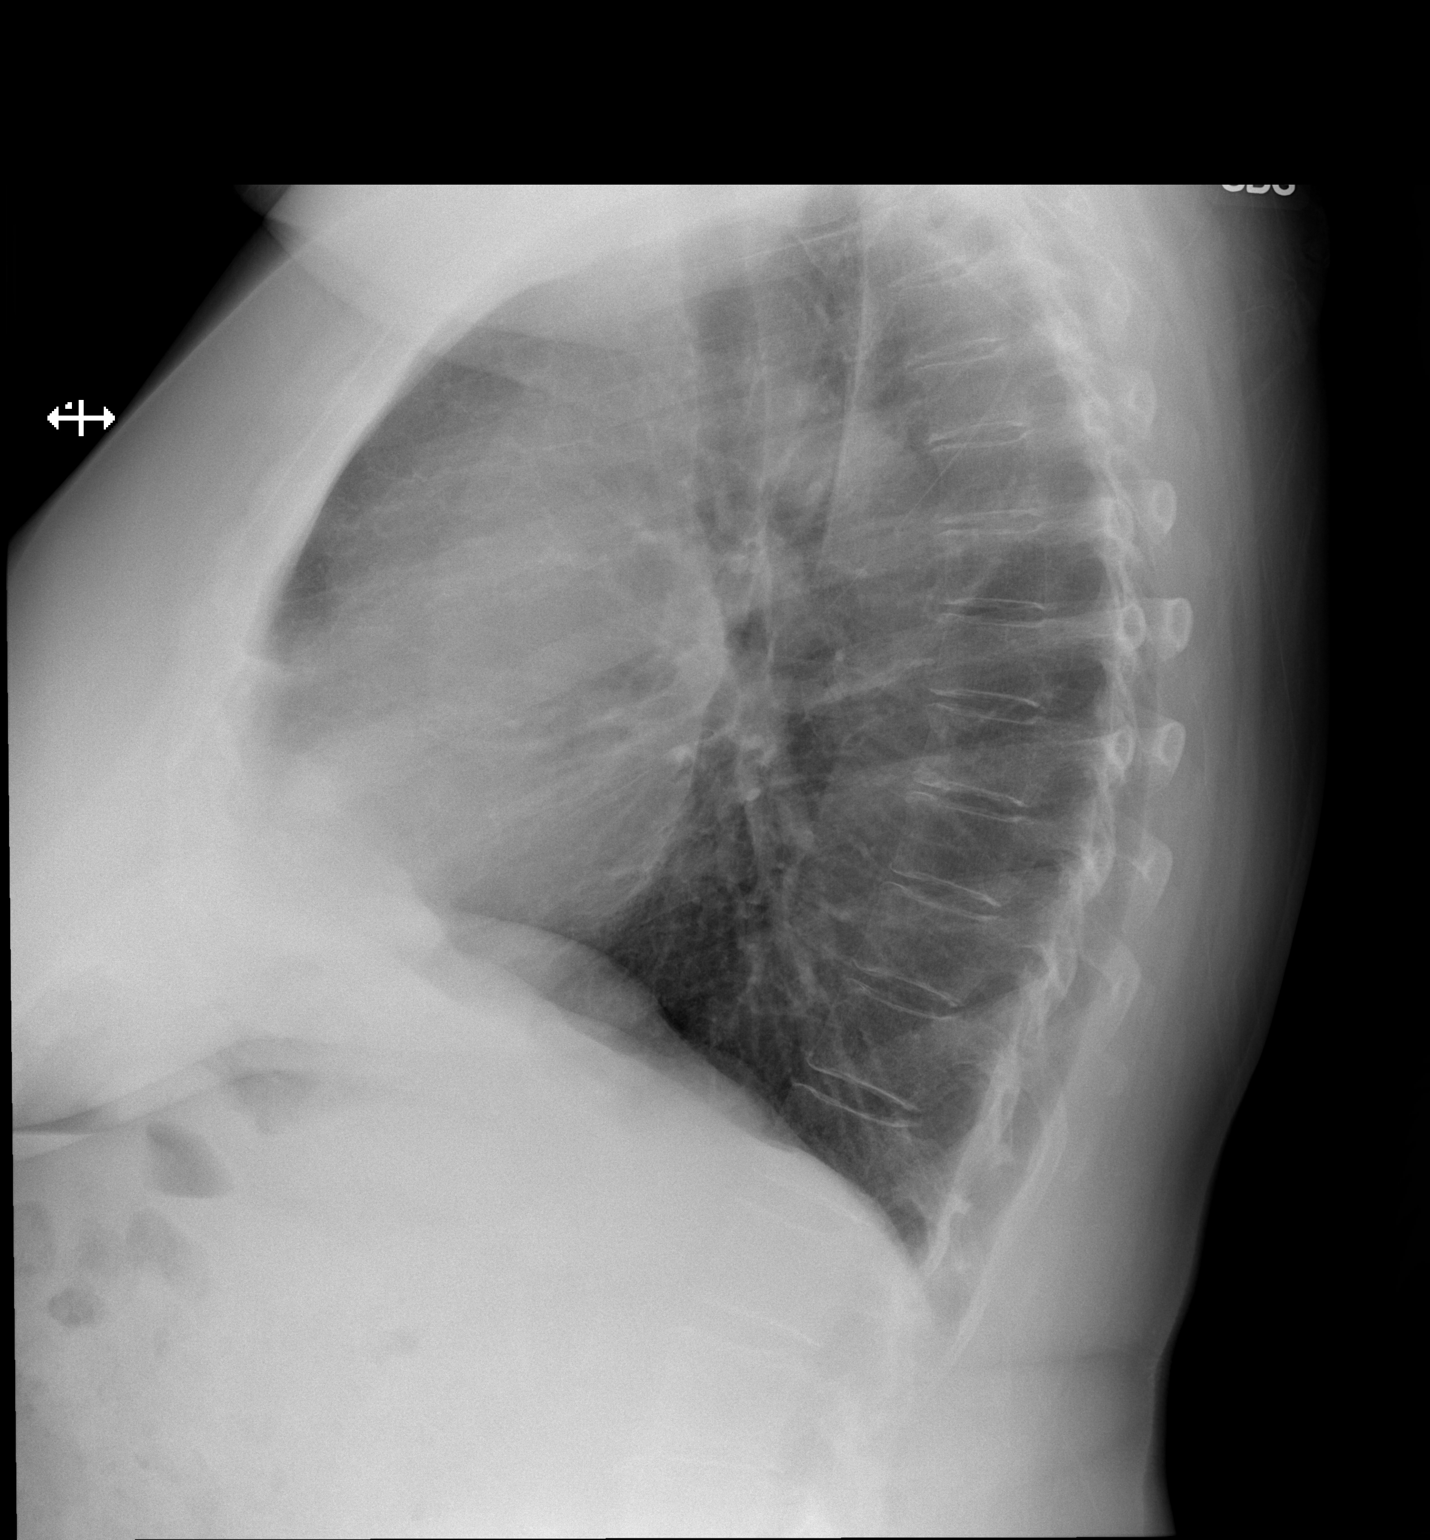

[2 of 2 positions shown; findings below may reference images not displayed]

FINDINGS: The heart size and mediastinal contours are within normal limits.
Both lungs are clear. The visualized skeletal structures are
unremarkable.
IMPRESSION: No active cardiopulmonary disease.

## 2015-04-16 DIAGNOSIS — F411 Generalized anxiety disorder: Secondary | ICD-10-CM | POA: Diagnosis not present

## 2015-06-09 DIAGNOSIS — M545 Low back pain: Secondary | ICD-10-CM | POA: Diagnosis not present

## 2015-06-18 DIAGNOSIS — F419 Anxiety disorder, unspecified: Secondary | ICD-10-CM | POA: Diagnosis not present

## 2015-06-18 DIAGNOSIS — I10 Essential (primary) hypertension: Secondary | ICD-10-CM | POA: Diagnosis not present

## 2015-10-29 DIAGNOSIS — H2513 Age-related nuclear cataract, bilateral: Secondary | ICD-10-CM | POA: Diagnosis not present

## 2015-10-29 DIAGNOSIS — H25013 Cortical age-related cataract, bilateral: Secondary | ICD-10-CM | POA: Diagnosis not present

## 2015-10-29 DIAGNOSIS — D3132 Benign neoplasm of left choroid: Secondary | ICD-10-CM | POA: Diagnosis not present

## 2015-12-08 DIAGNOSIS — I10 Essential (primary) hypertension: Secondary | ICD-10-CM | POA: Diagnosis not present

## 2015-12-08 DIAGNOSIS — Z23 Encounter for immunization: Secondary | ICD-10-CM | POA: Diagnosis not present

## 2015-12-08 DIAGNOSIS — F419 Anxiety disorder, unspecified: Secondary | ICD-10-CM | POA: Diagnosis not present

## 2016-03-24 DIAGNOSIS — Z1231 Encounter for screening mammogram for malignant neoplasm of breast: Secondary | ICD-10-CM | POA: Diagnosis not present

## 2016-04-22 DIAGNOSIS — J069 Acute upper respiratory infection, unspecified: Secondary | ICD-10-CM | POA: Diagnosis not present

## 2016-04-22 DIAGNOSIS — R110 Nausea: Secondary | ICD-10-CM | POA: Diagnosis not present

## 2016-04-22 DIAGNOSIS — R52 Pain, unspecified: Secondary | ICD-10-CM | POA: Diagnosis not present

## 2016-07-05 DIAGNOSIS — F419 Anxiety disorder, unspecified: Secondary | ICD-10-CM | POA: Diagnosis not present

## 2016-07-05 DIAGNOSIS — I10 Essential (primary) hypertension: Secondary | ICD-10-CM | POA: Diagnosis not present

## 2016-07-05 DIAGNOSIS — Z0000 Encounter for general adult medical examination without abnormal findings: Secondary | ICD-10-CM | POA: Diagnosis not present

## 2016-07-05 DIAGNOSIS — Z1159 Encounter for screening for other viral diseases: Secondary | ICD-10-CM | POA: Diagnosis not present

## 2016-07-06 DIAGNOSIS — F419 Anxiety disorder, unspecified: Secondary | ICD-10-CM | POA: Diagnosis not present

## 2016-07-06 DIAGNOSIS — Z0000 Encounter for general adult medical examination without abnormal findings: Secondary | ICD-10-CM | POA: Diagnosis not present

## 2016-07-06 DIAGNOSIS — Z1159 Encounter for screening for other viral diseases: Secondary | ICD-10-CM | POA: Diagnosis not present

## 2016-07-06 DIAGNOSIS — I10 Essential (primary) hypertension: Secondary | ICD-10-CM | POA: Diagnosis not present

## 2016-12-16 DIAGNOSIS — Z23 Encounter for immunization: Secondary | ICD-10-CM | POA: Diagnosis not present

## 2016-12-20 DIAGNOSIS — Z23 Encounter for immunization: Secondary | ICD-10-CM | POA: Diagnosis not present

## 2016-12-20 DIAGNOSIS — F419 Anxiety disorder, unspecified: Secondary | ICD-10-CM | POA: Diagnosis not present

## 2016-12-20 DIAGNOSIS — Z1211 Encounter for screening for malignant neoplasm of colon: Secondary | ICD-10-CM | POA: Diagnosis not present

## 2016-12-20 DIAGNOSIS — I10 Essential (primary) hypertension: Secondary | ICD-10-CM | POA: Diagnosis not present

## 2016-12-20 DIAGNOSIS — E785 Hyperlipidemia, unspecified: Secondary | ICD-10-CM | POA: Diagnosis not present

## 2017-03-29 DIAGNOSIS — Z1231 Encounter for screening mammogram for malignant neoplasm of breast: Secondary | ICD-10-CM | POA: Diagnosis not present

## 2017-05-24 DIAGNOSIS — J069 Acute upper respiratory infection, unspecified: Secondary | ICD-10-CM | POA: Diagnosis not present

## 2017-05-24 DIAGNOSIS — J029 Acute pharyngitis, unspecified: Secondary | ICD-10-CM | POA: Diagnosis not present

## 2017-05-24 DIAGNOSIS — Z20818 Contact with and (suspected) exposure to other bacterial communicable diseases: Secondary | ICD-10-CM | POA: Diagnosis not present

## 2017-09-05 DIAGNOSIS — F419 Anxiety disorder, unspecified: Secondary | ICD-10-CM | POA: Diagnosis not present

## 2017-09-05 DIAGNOSIS — Z0000 Encounter for general adult medical examination without abnormal findings: Secondary | ICD-10-CM | POA: Diagnosis not present

## 2017-09-05 DIAGNOSIS — M25571 Pain in right ankle and joints of right foot: Secondary | ICD-10-CM | POA: Diagnosis not present

## 2017-09-05 DIAGNOSIS — I10 Essential (primary) hypertension: Secondary | ICD-10-CM | POA: Diagnosis not present

## 2017-09-05 DIAGNOSIS — E785 Hyperlipidemia, unspecified: Secondary | ICD-10-CM | POA: Diagnosis not present

## 2017-09-07 DIAGNOSIS — E785 Hyperlipidemia, unspecified: Secondary | ICD-10-CM | POA: Diagnosis not present

## 2017-09-07 DIAGNOSIS — I10 Essential (primary) hypertension: Secondary | ICD-10-CM | POA: Diagnosis not present

## 2017-10-06 ENCOUNTER — Ambulatory Visit (INDEPENDENT_AMBULATORY_CARE_PROVIDER_SITE_OTHER): Payer: Medicare Other | Admitting: Orthopaedic Surgery

## 2017-10-06 ENCOUNTER — Ambulatory Visit (INDEPENDENT_AMBULATORY_CARE_PROVIDER_SITE_OTHER): Payer: Medicare Other

## 2017-10-06 ENCOUNTER — Encounter (INDEPENDENT_AMBULATORY_CARE_PROVIDER_SITE_OTHER): Payer: Self-pay | Admitting: Orthopaedic Surgery

## 2017-10-06 DIAGNOSIS — M79671 Pain in right foot: Secondary | ICD-10-CM

## 2017-10-06 MED ORDER — DICLOFENAC SODIUM 1 % TD GEL: 2.0000 g | Freq: Four times a day (QID) | TRANSDERMAL | 5 refills | Status: DC

## 2017-10-06 MED ORDER — PREDNISONE 10 MG (21) PO TBPK: ORAL_TABLET | ORAL | 0 refills | Status: AC

## 2017-10-06 NOTE — Progress Notes (Signed)
Office Visit Note   Patient: Melissa Wilkinson           Date of Birth: 13-Nov-1945           MRN: [id number] Visit Date: 10/06/2017              Requested by: Elton Sin, [address] [place], [place] [address] PCP: Elton Sin, FNP   Assessment & Plan: Visit Diagnoses:  1. Pain of right heel     Plan: Impression is right insertional Achilles tendinosis.  She has a small calcification at the distal extent of the Achilles.  I recommend physical therapy with modalities along with home exercises and prednisone taper and Voltaren gel and a Cam boot for mobilization.  Questions encouraged and answered.  Follow-up as needed.  Follow-Up Instructions: Return if symptoms worsen or fail to improve.   Orders:  Orders Placed This Encounter  Procedures  . XR Os Calcis Right   Meds ordered this encounter  Medications  . predniSONE (STERAPRED UNI-PAK 21 TAB) 10 MG (21) TBPK tablet    Sig: Take as directed    Dispense:  21 tablet    Refill:  0  . diclofenac sodium (VOLTAREN) 1 % GEL    Sig: Apply 2 g topically 4 (four) times daily.    Dispense:  1 Tube    Refill:  5      Procedures: No procedures performed   Clinical Data: No additional findings.   Subjective: Chief Complaint  Patient presents with  . Right Foot - Pain    Melissa Wilkinson is a pleasant 72 year old female who comes in with right heel pain approximately 4 to 5 months.  She denies any injuries.  She previously had plantar fasciitis that resolved with treatment and night splinting.  She now has pain at the insertion of the Achilles that is worse with activity and with walking uphill.  She denies any numbness and tingling.  Denies any back pain.  She has tried heel lifts.  She is supposed to be going to [place] in about 2 months.   Review of Systems  Constitutional: Negative.   HENT: Negative.   Eyes: Negative.   Respiratory: Negative.   Cardiovascular: Negative.   Endocrine: Negative.     Musculoskeletal: Negative.   Neurological: Negative.   Hematological: Negative.   Psychiatric/Behavioral: Negative.   All other systems reviewed and are negative.    Objective: Vital Signs: There were no vitals taken for this visit.  Physical Exam  Constitutional: She is oriented to person, place, and time. She appears well-developed and well-nourished.  HENT:  Head: Normocephalic and atraumatic.  Eyes: EOM are normal.  Neck: Neck supple.  Pulmonary/Chest: Effort normal.  Abdominal: Soft.  Neurological: She is alert and oriented to person, place, and time.  Skin: Skin is warm. Capillary refill takes less than 2 seconds.  Psychiatric: She has a normal mood and affect. Her behavior is normal. Judgment and thought content normal.  Nursing note and vitals reviewed.   Ortho Exam Right heel exam shows point tenderness at the insertion of the Achilles.  Plantar fascia is nontender.  Normal Thompson's.  Mild swelling of the posterior aspect of the heel.  Normal strength. Specialty Comments:  No specialty comments available.  Imaging: No results found.   PMFS History: Patient Active Problem List   Diagnosis Date Noted  . DJD (degenerative joint disease) of knee 03/20/2013   Past Medical History:  Diagnosis Date  .  Anxiety   . Arthritis   . Blood dyscrasia    family history of c protien deficiency   . Complication of anesthesia    difficult waking after c section  . Depression   . GERD (gastroesophageal reflux disease)   . Hypertension   . Seasonal allergies   . Wears glasses     History reviewed. No pertinent family history.  Past Surgical History:  Procedure Laterality Date  . APPENDECTOMY    . CESAREAN SECTION     x2  . COLONOSCOPY    . DILATION AND CURETTAGE OF UTERUS    . KNEE ARTHROSCOPY  1989   left  . KNEE ARTHROSCOPY WITH MEDIAL MENISECTOMY Right 09/13/2012   Procedure: RIGHT KNEE ARTHROSCOPY WITH PARTIAL MEDIAL LATERAL MENISECTOMY CHRONDROPLASTY;   Surgeon: Ninetta Lights, MD;  Location: [facility];  Service: Orthopedics;  Laterality: Right;  . TOTAL KNEE ARTHROPLASTY Right 03/20/2013   DR MURPHY  . TOTAL KNEE ARTHROPLASTY Right 03/20/2013   Procedure: RIGHT TOTAL KNEE ARTHROPLASTY;  Surgeon: Ninetta Lights, MD;  Location: [facility];  Service: Orthopedics;  Laterality: Right;   Social History   Occupational History  . Not on file  Tobacco Use  . Smoking status: Never Smoker  . Smokeless tobacco: Never Used  Substance and Sexual Activity  . Alcohol use: Yes    Comment: daily to occ  . Drug use: No  . Sexual activity: Not on file

## 2017-10-10 ENCOUNTER — Telehealth (INDEPENDENT_AMBULATORY_CARE_PROVIDER_SITE_OTHER): Payer: Self-pay

## 2017-10-10 DIAGNOSIS — S86301A Unspecified injury of muscle(s) and tendon(s) of peroneal muscle group at lower leg level, right leg, initial encounter: Secondary | ICD-10-CM | POA: Diagnosis not present

## 2017-10-10 NOTE — Telephone Encounter (Signed)
Did PA through [email or web address]  Darnelle Going (Key: [id number])    Pending Auth

## 2017-10-10 NOTE — Telephone Encounter (Signed)
Your information has been submitted to [employer]. To check for an updated outcome later, reopen this PA request from your dashboard. If [employer] has not responded to your request within 24 hours, contact [employer] at [phone]. If you think there may be a problem with your PA request, use our live chat feature at the bottom right.    

## 2017-10-12 DIAGNOSIS — M7661 Achilles tendinitis, right leg: Secondary | ICD-10-CM | POA: Diagnosis not present

## 2017-10-17 DIAGNOSIS — M7661 Achilles tendinitis, right leg: Secondary | ICD-10-CM | POA: Diagnosis not present

## 2017-10-19 DIAGNOSIS — M7661 Achilles tendinitis, right leg: Secondary | ICD-10-CM | POA: Diagnosis not present

## 2017-10-26 DIAGNOSIS — M7661 Achilles tendinitis, right leg: Secondary | ICD-10-CM | POA: Diagnosis not present

## 2017-10-31 DIAGNOSIS — M7661 Achilles tendinitis, right leg: Secondary | ICD-10-CM | POA: Diagnosis not present

## 2017-11-02 DIAGNOSIS — M7661 Achilles tendinitis, right leg: Secondary | ICD-10-CM | POA: Diagnosis not present

## 2017-11-07 DIAGNOSIS — M7661 Achilles tendinitis, right leg: Secondary | ICD-10-CM | POA: Diagnosis not present

## 2017-11-09 DIAGNOSIS — M7661 Achilles tendinitis, right leg: Secondary | ICD-10-CM | POA: Diagnosis not present

## 2017-11-14 DIAGNOSIS — M7661 Achilles tendinitis, right leg: Secondary | ICD-10-CM | POA: Diagnosis not present

## 2017-11-21 DIAGNOSIS — M7661 Achilles tendinitis, right leg: Secondary | ICD-10-CM | POA: Diagnosis not present

## 2017-11-28 DIAGNOSIS — M7661 Achilles tendinitis, right leg: Secondary | ICD-10-CM | POA: Diagnosis not present

## 2017-11-29 ENCOUNTER — Telehealth (INDEPENDENT_AMBULATORY_CARE_PROVIDER_SITE_OTHER): Payer: Self-pay | Admitting: Physician Assistant

## 2017-11-29 ENCOUNTER — Other Ambulatory Visit (INDEPENDENT_AMBULATORY_CARE_PROVIDER_SITE_OTHER): Payer: Self-pay

## 2017-11-29 MED ORDER — TRAMADOL HCL 50 MG PO TABS: ORAL_TABLET | ORAL | 0 refills | Status: DC

## 2017-11-29 NOTE — Telephone Encounter (Signed)
Called into pharm  

## 2017-11-29 NOTE — Telephone Encounter (Signed)
Can you please call in tramadol 50mg  1-2 q6-8 #30, no refills [facility]

## 2018-02-08 ENCOUNTER — Telehealth (INDEPENDENT_AMBULATORY_CARE_PROVIDER_SITE_OTHER): Payer: Self-pay

## 2018-02-08 ENCOUNTER — Other Ambulatory Visit (INDEPENDENT_AMBULATORY_CARE_PROVIDER_SITE_OTHER): Payer: Self-pay

## 2018-02-08 MED ORDER — TRAMADOL HCL 50 MG PO TABS: 50.0000 mg | ORAL_TABLET | Freq: Two times a day (BID) | ORAL | 0 refills | Status: DC

## 2018-02-08 NOTE — Telephone Encounter (Signed)
[facility] called concerning a Rx refill for tramadol for patient.  Cb# is [phone].  Please advise.  Thank you.

## 2018-02-08 NOTE — Telephone Encounter (Signed)
CALLED INTO PHARM  

## 2018-03-27 DIAGNOSIS — Z23 Encounter for immunization: Secondary | ICD-10-CM | POA: Diagnosis not present

## 2018-04-04 DIAGNOSIS — Z1231 Encounter for screening mammogram for malignant neoplasm of breast: Secondary | ICD-10-CM | POA: Diagnosis not present

## 2018-05-07 DIAGNOSIS — I10 Essential (primary) hypertension: Secondary | ICD-10-CM | POA: Diagnosis not present

## 2018-05-07 DIAGNOSIS — J309 Allergic rhinitis, unspecified: Secondary | ICD-10-CM | POA: Diagnosis not present

## 2018-05-07 DIAGNOSIS — B001 Herpesviral vesicular dermatitis: Secondary | ICD-10-CM | POA: Diagnosis not present

## 2018-05-07 DIAGNOSIS — E785 Hyperlipidemia, unspecified: Secondary | ICD-10-CM | POA: Diagnosis not present

## 2018-05-07 DIAGNOSIS — K219 Gastro-esophageal reflux disease without esophagitis: Secondary | ICD-10-CM | POA: Diagnosis not present

## 2018-05-07 DIAGNOSIS — F419 Anxiety disorder, unspecified: Secondary | ICD-10-CM | POA: Diagnosis not present

## 2018-08-08 ENCOUNTER — Other Ambulatory Visit (INDEPENDENT_AMBULATORY_CARE_PROVIDER_SITE_OTHER): Payer: Self-pay | Admitting: Physician Assistant

## 2018-08-10 ENCOUNTER — Other Ambulatory Visit: Payer: Self-pay | Admitting: Physician Assistant

## 2018-08-10 MED ORDER — TRAMADOL HCL 50 MG PO TABS: 50.0000 mg | ORAL_TABLET | Freq: Two times a day (BID) | ORAL | 1 refills | Status: AC | PRN

## 2018-08-28 ENCOUNTER — Other Ambulatory Visit (INDEPENDENT_AMBULATORY_CARE_PROVIDER_SITE_OTHER): Payer: Self-pay | Admitting: Orthopaedic Surgery

## 2018-11-07 DIAGNOSIS — Z1211 Encounter for screening for malignant neoplasm of colon: Secondary | ICD-10-CM | POA: Diagnosis not present

## 2018-11-07 DIAGNOSIS — E785 Hyperlipidemia, unspecified: Secondary | ICD-10-CM | POA: Diagnosis not present

## 2018-11-07 DIAGNOSIS — I10 Essential (primary) hypertension: Secondary | ICD-10-CM | POA: Diagnosis not present

## 2018-11-07 DIAGNOSIS — F419 Anxiety disorder, unspecified: Secondary | ICD-10-CM | POA: Diagnosis not present

## 2018-11-07 DIAGNOSIS — K219 Gastro-esophageal reflux disease without esophagitis: Secondary | ICD-10-CM | POA: Diagnosis not present

## 2018-12-14 DIAGNOSIS — Z1211 Encounter for screening for malignant neoplasm of colon: Secondary | ICD-10-CM | POA: Diagnosis not present

## 2019-04-13 ENCOUNTER — Ambulatory Visit: Payer: Medicare Other | Attending: Internal Medicine

## 2019-04-13 DIAGNOSIS — Z23 Encounter for immunization: Secondary | ICD-10-CM | POA: Insufficient documentation

## 2019-04-13 NOTE — Progress Notes (Signed)
   Covid-19 Vaccination Clinic  Name:  Melissa Wilkinson    MRN: [id number] DOB: Feb 01, 1946  04/13/2019  [name] was observed post Covid-19 immunization for 15 minutes without incidence. She was provided with Vaccine Information Sheet and instruction to access the V-Safe system.   [name] was instructed to call 911 with any severe reactions post vaccine: Marland Kitchen Difficulty breathing  . Swelling of your face and throat  . A fast heartbeat  . A bad rash all over your body  . Dizziness and weakness    Immunizations Administered    Name Date Dose VIS Date Route   Pfizer COVID-19 Vaccine 04/13/2019  8:18 AM 0.3 mL 02/01/2019 Intramuscular   Manufacturer: [place]   Lot: [id number]   [place]: [id number]

## 2019-05-07 ENCOUNTER — Ambulatory Visit: Payer: Medicare Other | Attending: Internal Medicine

## 2019-05-07 DIAGNOSIS — Z23 Encounter for immunization: Secondary | ICD-10-CM

## 2019-05-07 NOTE — Progress Notes (Signed)
   Covid-19 Vaccination Clinic  Name:  Melissa Wilkinson    MRN: [id number] DOB: June 18, 1945  05/07/2019  Melissa Wilkinson was observed post Covid-19 immunization for 15 minutes without incident. She was provided with Vaccine Information Sheet and instruction to access the V-Safe system.   Melissa Wilkinson was instructed to call 911 with any severe reactions post vaccine: Marland Kitchen Difficulty breathing  . Swelling of face and throat  . A fast heartbeat  . A bad rash all over body  . Dizziness and weakness   Immunizations Administered    Name Date Dose VIS Date Route   Pfizer COVID-19 Vaccine 05/07/2019  9:21 AM 0.3 mL 02/01/2019 Intramuscular   Manufacturer: [place]   Lot: [id number]   [place]: [id number]

## 2019-06-12 DIAGNOSIS — F419 Anxiety disorder, unspecified: Secondary | ICD-10-CM | POA: Diagnosis not present

## 2019-06-12 DIAGNOSIS — E785 Hyperlipidemia, unspecified: Secondary | ICD-10-CM | POA: Diagnosis not present

## 2019-06-12 DIAGNOSIS — I10 Essential (primary) hypertension: Secondary | ICD-10-CM | POA: Diagnosis not present

## 2019-06-17 DIAGNOSIS — Z1231 Encounter for screening mammogram for malignant neoplasm of breast: Secondary | ICD-10-CM | POA: Diagnosis not present

## 2019-07-26 DIAGNOSIS — N764 Abscess of vulva: Secondary | ICD-10-CM | POA: Diagnosis not present

## 2019-08-08 DIAGNOSIS — H43813 Vitreous degeneration, bilateral: Secondary | ICD-10-CM | POA: Diagnosis not present

## 2019-08-08 DIAGNOSIS — H25013 Cortical age-related cataract, bilateral: Secondary | ICD-10-CM | POA: Diagnosis not present

## 2019-08-08 DIAGNOSIS — H2513 Age-related nuclear cataract, bilateral: Secondary | ICD-10-CM | POA: Diagnosis not present

## 2019-08-16 DIAGNOSIS — L738 Other specified follicular disorders: Secondary | ICD-10-CM | POA: Diagnosis not present

## 2019-08-16 DIAGNOSIS — L720 Epidermal cyst: Secondary | ICD-10-CM | POA: Diagnosis not present

## 2020-01-11 ENCOUNTER — Emergency Department (HOSPITAL_BASED_OUTPATIENT_CLINIC_OR_DEPARTMENT_OTHER): Payer: Medicare Other

## 2020-01-11 ENCOUNTER — Emergency Department (HOSPITAL_BASED_OUTPATIENT_CLINIC_OR_DEPARTMENT_OTHER)
Admission: EM | Admit: 2020-01-11 | Discharge: 2020-01-11 | Disposition: A | Discharge status: Home / Self Care | Payer: Medicare Other | Attending: Emergency Medicine | Admitting: Emergency Medicine

## 2020-01-11 ENCOUNTER — Encounter (HOSPITAL_BASED_OUTPATIENT_CLINIC_OR_DEPARTMENT_OTHER): Payer: Self-pay | Admitting: Emergency Medicine

## 2020-01-11 ENCOUNTER — Other Ambulatory Visit: Payer: Self-pay

## 2020-01-11 DIAGNOSIS — R519 Headache, unspecified: Secondary | ICD-10-CM | POA: Insufficient documentation

## 2020-01-11 DIAGNOSIS — Z79899 Other long term (current) drug therapy: Secondary | ICD-10-CM | POA: Diagnosis not present

## 2020-01-11 DIAGNOSIS — I10 Essential (primary) hypertension: Secondary | ICD-10-CM | POA: Insufficient documentation

## 2020-01-11 DIAGNOSIS — Z7982 Long term (current) use of aspirin: Secondary | ICD-10-CM | POA: Insufficient documentation

## 2020-01-11 DIAGNOSIS — Z96651 Presence of right artificial knee joint: Secondary | ICD-10-CM | POA: Insufficient documentation

## 2020-01-11 LAB — CBC WITH DIFFERENTIAL/PLATELET
Abs Immature Granulocytes: 0.03 10*3/uL (ref 0.00–0.07)
Basophils Absolute: 0 10*3/uL (ref 0.0–0.1)
Basophils Relative: 0 %
Eosinophils Absolute: 0.1 10*3/uL (ref 0.0–0.5)
Eosinophils Relative: 1 %
HCT: 41.4 % (ref 36.0–46.0)
Hemoglobin: 13.5 g/dL (ref 12.0–15.0)
Immature Granulocytes: 0 %
Lymphocytes Relative: 18 %
Lymphs Abs: 1.7 10*3/uL (ref 0.7–4.0)
MCH: 31.2 pg (ref 26.0–34.0)
MCHC: 32.6 g/dL (ref 30.0–36.0)
MCV: 95.6 fL (ref 80.0–100.0)
Monocytes Absolute: 0.7 10*3/uL (ref 0.1–1.0)
Monocytes Relative: 7 %
Neutro Abs: 7.1 10*3/uL (ref 1.7–7.7)
Neutrophils Relative %: 74 %
Platelets: 383 10*3/uL (ref 150–400)
RBC: 4.33 MIL/uL (ref 3.87–5.11)
RDW: 12 % (ref 11.5–15.5)
WBC: 9.7 10*3/uL (ref 4.0–10.5)
nRBC: 0 % (ref 0.0–0.2)

## 2020-01-11 LAB — PROTIME-INR
INR: 0.9 (ref 0.8–1.2)
Prothrombin Time: 11.7 seconds (ref 11.4–15.2)

## 2020-01-11 LAB — BASIC METABOLIC PANEL
Anion gap: 12 (ref 5–15)
BUN: 24 mg/dL — ABNORMAL HIGH (ref 8–23)
CO2: 24 mmol/L (ref 22–32)
Calcium: 8.9 mg/dL (ref 8.9–10.3)
Chloride: 96 mmol/L — ABNORMAL LOW (ref 98–111)
Creatinine, Ser: 1.27 mg/dL — ABNORMAL HIGH (ref 0.44–1.00)
GFR, Estimated: 45 mL/min — ABNORMAL LOW (ref >60)
Glucose, Bld: 103 mg/dL — ABNORMAL HIGH (ref 70–99)
Potassium: 4.3 mmol/L (ref 3.5–5.1)
Sodium: 132 mmol/L — ABNORMAL LOW (ref 135–145)

## 2020-01-11 LAB — SEDIMENTATION RATE: Sed Rate: 12 mm/hr (ref 0–22)

## 2020-01-11 MED ORDER — SODIUM CHLORIDE 0.9 % IV SOLN: Freq: Once | INTRAVENOUS | Status: AC

## 2020-01-11 MED ORDER — IOHEXOL 350 MG/ML SOLN
100.0000 mL | Freq: Once | INTRAVENOUS | Status: AC | PRN
  Administered 2020-01-11: 75 mL via INTRAVENOUS

## 2020-01-11 NOTE — ED Provider Notes (Signed)
Patient care assumed at 1500. Patient here for evaluation of headache, CTA is pending. CTA is negative for acute process. Sedrate is normal. Her headache is almost resolved. Discussed with patient unclear source of headache. Presentation is not consistent with subarachnoid hemorrhage, temporal arteritis, dural sinus thrombosis. Discussed with patient home care for headache. Discussed PCP follow-up and return precautions.   Melissa Reichert, MD 01/11/20 Vernelle Emerald

## 2020-01-11 NOTE — ED Provider Notes (Signed)
[facility] EMERGENCY DEPARTMENT Provider Note   CSN: [id number] Arrival date & time: 01/11/20  1147     History Chief Complaint  Patient presents with  . Headache    Melissa Wilkinson is a 74 y.o. female.  HPI Patient reports that he had a sudden onset of a severe headache at her right temple.  He had gotten up this morning and felt well.  She and her husband went to the [employer].  She reports that she quite suddenly got an intense throbbing pain on the right side of her temple that escalated.  She reports she briefly felt a little disoriented but made her way back to the car.  Loss of consciousness.  No visual change or loss.  No focal weakness numbness or tingling of extremities.  She reports since initial headache this has started to subside somewhat.  She reports she still has a bit of feeling of pressure on the side of her head but much less than previously.  He did not develop any vomiting.  No recent fever chills or sinus congestion.  Patient did have wisdom tooth extraction about 3 weeks ago but has not been having any problems with facial swelling or pain.  Patient otherwise has been in good condition.  Patient reports family history positive for protein C deficiency with thrombotic problems.  She reports she however was tested and was negative.  He does report several family members have had aneurysms.    Past Medical History:  Diagnosis Date  . Anxiety   . Arthritis   . Blood dyscrasia    family history of c protien deficiency   . Complication of anesthesia    difficult waking after c section  . Depression   . GERD (gastroesophageal reflux disease)   . Hypertension   . Seasonal allergies   . Wears glasses     Patient Active Problem List   Diagnosis Date Noted  . DJD (degenerative joint disease) of knee 03/20/2013    Past Surgical History:  Procedure Laterality Date  . APPENDECTOMY    . CESAREAN SECTION     x2  . COLONOSCOPY    . DILATION  AND CURETTAGE OF UTERUS    . KNEE ARTHROSCOPY  1989   left  . KNEE ARTHROSCOPY WITH MEDIAL MENISECTOMY Right 09/13/2012   Procedure: RIGHT KNEE ARTHROSCOPY WITH PARTIAL MEDIAL LATERAL MENISECTOMY CHRONDROPLASTY;  Surgeon: Ninetta Lights, MD;  Location: [facility];  Service: Orthopedics;  Laterality: Right;  . TOTAL KNEE ARTHROPLASTY Right 03/20/2013   DR MURPHY  . TOTAL KNEE ARTHROPLASTY Right 03/20/2013   Procedure: RIGHT TOTAL KNEE ARTHROPLASTY;  Surgeon: Ninetta Lights, MD;  Location: [facility];  Service: Orthopedics;  Laterality: Right;     OB History   No obstetric history on file.     No family history on file.  Social History   Tobacco Use  . Smoking status: Never Smoker  . Smokeless tobacco: Never Used  Substance Use Topics  . Alcohol use: Yes    Comment: daily to occ  . Drug use: No    Home Medications Prior to Admission medications   Medication Sig Start Date End Date Taking? Authorizing Provider  ALPRAZolam Duanne Moron) 1 MG tablet Take 0.5-1 mg by mouth 3 (three) times daily as needed for anxiety or sleep.     [provider]  aspirin EC 325 MG EC tablet Take 1 tablet (325 mg total) by mouth daily with breakfast. 03/21/13  Aundra Dubin, PA-C  aspirin EC 325 MG tablet Take 1 tablet (325 mg total) by mouth daily. 03/20/13   Aundra Dubin, PA-C  diclofenac sodium (VOLTAREN) 1 % GEL APPLY 2 GRAMS TO AFFECTED AREA 4 TIMES A DAY 08/29/18   Leandrew Koyanagi, MD  DULoxetine (CYMBALTA) 60 MG capsule Take 60 mg by mouth every evening.     [provider]  fluticasone (FLONASE) 50 MCG/ACT nasal spray Place 2 sprays into the nose daily as needed for allergies. For allergy symptoms    [provider]  Fluticasone-Salmeterol (ADVAIR) 250-50 MCG/DOSE AEPB Inhale 1 puff into the lungs daily as needed (asthma).    [provider]  ketotifen (ZADITOR) 0.025 % ophthalmic solution Place 2 drops into both eyes daily as needed (allergies).     [provider]  methocarbamol (ROBAXIN) 500 MG tablet Take 1 tablet (500 mg total) by mouth every 6 (six) hours as needed for muscle spasms. 03/20/13   Aundra Dubin, PA-C  olmesartan-hydrochlorothiazide (BENICAR HCT) 40-25 MG per tablet Take 1 tablet by mouth daily.    [provider]  omeprazole (PRILOSEC) 20 MG capsule Take 20 mg by mouth daily.     [provider]  ondansetron (ZOFRAN) 4 MG tablet Take 1 tablet (4 mg total) by mouth every 8 (eight) hours as needed for nausea or vomiting. 03/20/13   Aundra Dubin, PA-C  oxyCODONE-acetaminophen (PERCOCET/ROXICET) 5-325 MG per tablet Take 1-2 tablets by mouth every 4 (four) hours as needed for severe pain. 03/21/13   Aundra Dubin, PA-C  oxyCODONE-acetaminophen (ROXICET) 5-325 MG per tablet Take 1-2 tablets by mouth every 4 (four) hours as needed for severe pain. 03/20/13   Aundra Dubin, PA-C  predniSONE (STERAPRED UNI-PAK 21 TAB) 10 MG (21) TBPK tablet Take as directed 10/06/17   Leandrew Koyanagi, MD  traMADol (ULTRAM) 50 MG tablet Take 1 tablet (50 mg total) by mouth 2 (two) times daily as needed. 08/10/18   Aundra Dubin, PA-C  VALTREX 500 MG tablet Take 1 tablet (500 mg total) by mouth daily. 03/21/13   Aundra Dubin, PA-C    Allergies    Hydrocodone and Morphine and related  Review of Systems   Review of Systems 10 systems reviewed and negative except as per HPI Physical Exam Updated Vital Signs BP 129/64 (BP Location: Right Arm)   Pulse 89   Temp 98.6 F (37 C) (Oral)   Resp 16   Ht 5' (1.524 m)   Wt 72.6 kg   SpO2 100%   BMI 31.25 kg/m   Physical Exam Constitutional:      Comments: Patient is alert and nontoxic.  No respiratory distress.  Well in appearance.  Mental status clear.  HENT:     Head: Normocephalic and atraumatic.     Comments: No pain to palpation over the temples or the temporal artery.  No focal soft tissue abnormalities or pain around the ear.  No rashes.    Nose: Nose  normal.     Mouth/Throat:     Mouth: Mucous membranes are moist.     Pharynx: Oropharynx is clear.     Comments: Dentition in very good condition. Eyes:     Conjunctiva/sclera: Conjunctivae normal.     Pupils: Pupils are equal, round, and reactive to light.  Neck:     Comments: No meningismus.  No cervical adenopathy. Cardiovascular:     Rate and Rhythm: Normal rate and regular rhythm.  Pulmonary:     Effort: Pulmonary effort is normal.     Breath sounds: Normal breath sounds.  Abdominal:     General: There is no distension.     Palpations: Abdomen is soft.     Tenderness: There is no abdominal tenderness. There is no guarding.  Musculoskeletal:        General: No swelling or tenderness. Normal range of motion.     Cervical back: Neck supple.     Right lower leg: No edema.     Left lower leg: No edema.  Skin:    General: Skin is warm and dry.  Neurological:     General: No focal deficit present.     Mental Status: She is oriented to person, place, and time.     Cranial Nerves: No cranial nerve deficit.     Sensory: No sensory deficit.     Motor: No weakness.     Coordination: Coordination normal.  Psychiatric:        Mood and Affect: Mood normal.     ED Results / Procedures / Treatments   Labs (all labs ordered are listed, but only abnormal results are displayed) Labs Reviewed  BASIC METABOLIC PANEL  CBC WITH DIFFERENTIAL/PLATELET  PROTIME-INR  SEDIMENTATION RATE    EKG None  Radiology CT Head Wo Contrast  Result Date: 01/11/2020 CLINICAL DATA:  Headache, intracranial hemorrhage suspected EXAM: CT HEAD WITHOUT CONTRAST TECHNIQUE: Contiguous axial images were obtained from the base of the skull through the vertex without intravenous contrast. COMPARISON:  None. FINDINGS: Brain: No evidence of acute infarction, hemorrhage, hydrocephalus, extra-axial collection or mass lesion/mass effect. Mild periventricular and deep white matter hypodensity. Vascular: No  hyperdense vessel or unexpected calcification. Skull: Normal. Negative for fracture or focal lesion. Sinuses/Orbits: No acute finding. Other: None. IMPRESSION: 1. No acute intracranial pathology. No non-contrast CT findings to explain headache. 2. Small-vessel white matter disease. Electronically Signed   By: Eddie Candle M.D.   On: 01/11/2020 13:21    Procedures Procedures (including critical care time)  Medications Ordered in ED Medications  0.9 %  sodium chloride infusion (has no administration in time range)    ED Course  I have reviewed the triage vital signs and the nursing notes.  Pertinent labs & imaging results that were available during my care of the patient were reviewed by me and considered in my medical decision making (see chart for details).    MDM Rules/Calculators/A&P                          Patient has sudden onset of temporal headache.  Patient does not typically have headaches.  She reports very briefly feeling slightly disoriented.  No focal motor deficits developed.  No vomiting.  Headache has started to abate.  She has normal neurologic exam.  Patient has normal blood pressure.  No signs of hypertensive crisis.  Review of systems is not suggestive of infectious etiology.  At this time with family history positive for aneurysm and sudden focal headache, will proceed with CT angiogram.  No signs of temporal arteritis.  No visual changes and no focal tenderness of the temples.  CT angio negative, anticipate discharge with follow-up on outpatient basis with PCP. Final Clinical Impression(s) / ED Diagnoses Final diagnoses:  Bad headache    Rx / DC Orders ED Discharge Orders    None       Charlesetta Shanks, MD 01/12/20 1511

## 2020-01-11 NOTE — ED Notes (Signed)
Patient transported to CT 

## 2020-01-11 NOTE — ED Notes (Signed)
Pt reports headache has lessened since arrival. States pain is now a "pressure in her right ear and right jaw" 4/10. "Nothng like my head was hurting". States she had right side upper and lower wisdom teeth removed in august this year.

## 2020-01-11 NOTE — ED Triage Notes (Signed)
Sudden onset of pain to L side of head radiating into her jaw x 20 min. BEFAST exam neg.

## 2020-03-16 ENCOUNTER — Other Ambulatory Visit: Payer: Medicare Other

## 2020-03-16 DIAGNOSIS — Z20822 Contact with and (suspected) exposure to covid-19: Secondary | ICD-10-CM

## 2020-03-17 LAB — NOVEL CORONAVIRUS, NAA: SARS-CoV-2, NAA: NOT DETECTED

## 2020-03-17 LAB — SARS-COV-2, NAA 2 DAY TAT

## 2020-03-19 DIAGNOSIS — Z23 Encounter for immunization: Secondary | ICD-10-CM | POA: Diagnosis not present

## 2020-07-24 DIAGNOSIS — F419 Anxiety disorder, unspecified: Secondary | ICD-10-CM | POA: Diagnosis not present

## 2020-09-25 DIAGNOSIS — S0990XA Unspecified injury of head, initial encounter: Secondary | ICD-10-CM | POA: Diagnosis not present

## 2020-10-01 ENCOUNTER — Emergency Department (HOSPITAL_BASED_OUTPATIENT_CLINIC_OR_DEPARTMENT_OTHER)
Admission: EM | Admit: 2020-10-01 | Discharge: 2020-10-01 | Disposition: A | Discharge status: Home / Self Care | Payer: Medicare Other | Attending: Emergency Medicine | Admitting: Emergency Medicine

## 2020-10-01 ENCOUNTER — Other Ambulatory Visit: Payer: Self-pay

## 2020-10-01 ENCOUNTER — Encounter (HOSPITAL_BASED_OUTPATIENT_CLINIC_OR_DEPARTMENT_OTHER): Payer: Self-pay | Admitting: Emergency Medicine

## 2020-10-01 ENCOUNTER — Emergency Department (HOSPITAL_BASED_OUTPATIENT_CLINIC_OR_DEPARTMENT_OTHER): Payer: Medicare Other

## 2020-10-01 DIAGNOSIS — Z96651 Presence of right artificial knee joint: Secondary | ICD-10-CM | POA: Diagnosis not present

## 2020-10-01 DIAGNOSIS — S065XAA Traumatic subdural hemorrhage with loss of consciousness status unknown, initial encounter: Secondary | ICD-10-CM

## 2020-10-01 DIAGNOSIS — U071 COVID-19: Secondary | ICD-10-CM | POA: Insufficient documentation

## 2020-10-01 DIAGNOSIS — R519 Headache, unspecified: Secondary | ICD-10-CM | POA: Diagnosis not present

## 2020-10-01 DIAGNOSIS — I10 Essential (primary) hypertension: Secondary | ICD-10-CM | POA: Diagnosis not present

## 2020-10-01 DIAGNOSIS — S065X0A Traumatic subdural hemorrhage without loss of consciousness, initial encounter: Secondary | ICD-10-CM | POA: Diagnosis not present

## 2020-10-01 DIAGNOSIS — S0990XA Unspecified injury of head, initial encounter: Secondary | ICD-10-CM | POA: Diagnosis present

## 2020-10-01 DIAGNOSIS — W01198A Fall on same level from slipping, tripping and stumbling with subsequent striking against other object, initial encounter: Secondary | ICD-10-CM | POA: Diagnosis not present

## 2020-10-01 DIAGNOSIS — S065X9A Traumatic subdural hemorrhage with loss of consciousness of unspecified duration, initial encounter: Secondary | ICD-10-CM

## 2020-10-01 LAB — CBC
HCT: 37.4 % (ref 36.0–46.0)
Hemoglobin: 12 g/dL (ref 12.0–15.0)
MCH: 31.1 pg (ref 26.0–34.0)
MCHC: 32.1 g/dL (ref 30.0–36.0)
MCV: 96.9 fL (ref 80.0–100.0)
Platelets: 389 10*3/uL (ref 150–400)
RBC: 3.86 MIL/uL — ABNORMAL LOW (ref 3.87–5.11)
RDW: 12.4 % (ref 11.5–15.5)
WBC: 7.4 10*3/uL (ref 4.0–10.5)
nRBC: 0 % (ref 0.0–0.2)

## 2020-10-01 LAB — BASIC METABOLIC PANEL
Anion gap: 9 (ref 5–15)
BUN: 12 mg/dL (ref 8–23)
CO2: 26 mmol/L (ref 22–32)
Calcium: 9.3 mg/dL (ref 8.9–10.3)
Chloride: 99 mmol/L (ref 98–111)
Creatinine, Ser: 0.63 mg/dL (ref 0.44–1.00)
GFR, Estimated: >60 mL/min (ref >60)
Glucose, Bld: 118 mg/dL — ABNORMAL HIGH (ref 70–99)
Potassium: 3.9 mmol/L (ref 3.5–5.1)
Sodium: 134 mmol/L — ABNORMAL LOW (ref 135–145)

## 2020-10-01 LAB — RESP PANEL BY RT-PCR (FLU A&B, COVID) ARPGX2
Influenza A by PCR: NEGATIVE
Influenza B by PCR: NEGATIVE
SARS Coronavirus 2 by RT PCR: POSITIVE — AB

## 2020-10-01 NOTE — Discharge Instructions (Addendum)
Your head CT today showed a small amount of blood in between your brain and your skull.  You will need to follow-up with the neurosurgeon listed next week and they will get a repeat CT scan.  Call tomorrow for appointment next week.  If at any point you develop new or worsening headache, vomiting, weakness or numbness, vision changes, trouble walking, or any other new/concerning symptoms then you need to return to the ER for evaluation.  Do not take aspirin's or Ibuprofen/Advil/Aleve/Motrin/Goody Powders/Naproxen/BC powders/Meloxicam/Diclofenac/Indomethacin and other Nonsteroidal anti-inflammatory medications    You may take Tylenol for pain.

## 2020-10-01 NOTE — ED Triage Notes (Signed)
Fell 2 weeks ago  and again last week  , hit head both times, states no LOC  no n/v , has had dizziness states she drove here today, pt informed NOT to drive when dizzy
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
## 2020-10-08 ENCOUNTER — Other Ambulatory Visit: Payer: Self-pay | Admitting: Neurological Surgery

## 2020-10-08 DIAGNOSIS — S065XAA Traumatic subdural hemorrhage with loss of consciousness status unknown, initial encounter: Secondary | ICD-10-CM

## 2020-10-08 DIAGNOSIS — S065X9A Traumatic subdural hemorrhage with loss of consciousness of unspecified duration, initial encounter: Secondary | ICD-10-CM | POA: Diagnosis not present

## 2020-10-08 DIAGNOSIS — R030 Elevated blood-pressure reading, without diagnosis of hypertension: Secondary | ICD-10-CM | POA: Diagnosis not present

## 2020-10-08 DIAGNOSIS — Z6828 Body mass index (BMI) 28.0-28.9, adult: Secondary | ICD-10-CM | POA: Diagnosis not present

## 2020-10-12 ENCOUNTER — Other Ambulatory Visit (HOSPITAL_BASED_OUTPATIENT_CLINIC_OR_DEPARTMENT_OTHER): Payer: Medicare Other

## 2020-10-13 ENCOUNTER — Other Ambulatory Visit: Payer: Self-pay

## 2020-10-13 ENCOUNTER — Ambulatory Visit (HOSPITAL_BASED_OUTPATIENT_CLINIC_OR_DEPARTMENT_OTHER)
Admission: RE | Admit: 2020-10-13 | Discharge: 2020-10-13 | Disposition: A | Discharge status: Home / Self Care | Payer: Medicare Other | Source: Ambulatory Visit | Attending: Neurological Surgery | Admitting: Neurological Surgery

## 2020-10-13 DIAGNOSIS — S065X9A Traumatic subdural hemorrhage with loss of consciousness of unspecified duration, initial encounter: Secondary | ICD-10-CM | POA: Insufficient documentation

## 2020-10-13 DIAGNOSIS — S065XAA Traumatic subdural hemorrhage with loss of consciousness status unknown, initial encounter: Secondary | ICD-10-CM

## 2020-10-13 DIAGNOSIS — I6200 Nontraumatic subdural hemorrhage, unspecified: Secondary | ICD-10-CM | POA: Diagnosis not present

## 2020-10-15 ENCOUNTER — Encounter (HOSPITAL_BASED_OUTPATIENT_CLINIC_OR_DEPARTMENT_OTHER): Payer: Self-pay

## 2020-10-15 ENCOUNTER — Ambulatory Visit (HOSPITAL_BASED_OUTPATIENT_CLINIC_OR_DEPARTMENT_OTHER): Payer: Medicare Other

## 2020-10-15 DIAGNOSIS — S065X9A Traumatic subdural hemorrhage with loss of consciousness of unspecified duration, initial encounter: Secondary | ICD-10-CM | POA: Diagnosis not present

## 2020-10-16 ENCOUNTER — Other Ambulatory Visit: Payer: Self-pay | Admitting: Neurological Surgery

## 2020-10-16 DIAGNOSIS — S065XAA Traumatic subdural hemorrhage with loss of consciousness status unknown, initial encounter: Secondary | ICD-10-CM

## 2020-10-29 ENCOUNTER — Emergency Department (HOSPITAL_BASED_OUTPATIENT_CLINIC_OR_DEPARTMENT_OTHER): Payer: Medicare Other

## 2020-10-29 ENCOUNTER — Emergency Department (HOSPITAL_BASED_OUTPATIENT_CLINIC_OR_DEPARTMENT_OTHER)
Admission: EM | Admit: 2020-10-29 | Discharge: 2020-10-29 | Disposition: A | Discharge status: Home / Self Care | Payer: Medicare Other | Attending: Emergency Medicine | Admitting: Emergency Medicine

## 2020-10-29 ENCOUNTER — Encounter (HOSPITAL_BASED_OUTPATIENT_CLINIC_OR_DEPARTMENT_OTHER): Payer: Self-pay | Admitting: Emergency Medicine

## 2020-10-29 ENCOUNTER — Other Ambulatory Visit: Payer: Self-pay

## 2020-10-29 DIAGNOSIS — Z79899 Other long term (current) drug therapy: Secondary | ICD-10-CM | POA: Insufficient documentation

## 2020-10-29 DIAGNOSIS — Z96651 Presence of right artificial knee joint: Secondary | ICD-10-CM | POA: Diagnosis not present

## 2020-10-29 DIAGNOSIS — R6884 Jaw pain: Secondary | ICD-10-CM | POA: Insufficient documentation

## 2020-10-29 DIAGNOSIS — I10 Essential (primary) hypertension: Secondary | ICD-10-CM | POA: Insufficient documentation

## 2020-10-29 DIAGNOSIS — R9431 Abnormal electrocardiogram [ECG] [EKG]: Secondary | ICD-10-CM | POA: Diagnosis not present

## 2020-10-29 DIAGNOSIS — R519 Headache, unspecified: Secondary | ICD-10-CM | POA: Diagnosis not present

## 2020-10-29 DIAGNOSIS — R42 Dizziness and giddiness: Secondary | ICD-10-CM | POA: Insufficient documentation

## 2020-10-29 MED ORDER — ACETAMINOPHEN 500 MG PO TABS: 1000.0000 mg | ORAL_TABLET | Freq: Once | ORAL | Status: DC

## 2020-10-29 NOTE — ED Notes (Signed)
Patient transported to CT 
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
## 2020-10-29 NOTE — ED Provider Notes (Signed)
[facility] EMERGENCY DEPT Provider Note   CSN: [id number] Arrival date & time: 10/29/20  1505     History Chief Complaint  Patient presents with   Dizziness    Melissa Wilkinson is a 75 y.o. female.  Patient is a 75 year old female who presents with right-sided headache.  She has a history of a recent subdural hematoma last month.  She is followed by Dr. Ronnald Ramp with neurosurgery.  On a recent CT scan, her subdural hematoma had improved.  She says today she was driving and had a sudden onset of pain in her right jaw area.  It radiated up toward her right temporal area on the right side of her head.  She says it still throbbing.  She said it felt like it came from one of her teeth.  She was not biting down or did not have any known injury to her teeth.  She denies any nausea or vomiting.  She has had some dizziness and a little bit of unsteadiness since she had the subdural hematoma but this is unchanged from the last few weeks.  No numbness or weakness to her extremities.  No fevers.      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    Past Surgical History:  Procedure Laterality Date   APPENDECTOMY     CESAREAN SECTION     x2   COLONOSCOPY     DILATION AND CURETTAGE OF UTERUS     KNEE ARTHROSCOPY  1989   left   KNEE ARTHROSCOPY WITH MEDIAL MENISECTOMY Right 09/13/2012   Procedure: RIGHT KNEE ARTHROSCOPY WITH PARTIAL MEDIAL LATERAL MENISECTOMY CHRONDROPLASTY;  Surgeon: Ninetta Lights, MD;  Location: [facility];  Service: Orthopedics;  Laterality: Right;   TOTAL KNEE ARTHROPLASTY  Right 03/20/2013   DR MURPHY   TOTAL KNEE ARTHROPLASTY Right 03/20/2013   Procedure: RIGHT TOTAL KNEE ARTHROPLASTY;  Surgeon: Ninetta Lights, MD;  Location: [facility];  Service: Orthopedics;  Laterality: Right;     OB History   No obstetric history on file.     History reviewed. No pertinent family history.  Social History   Tobacco Use   Smoking status: Never   Smokeless tobacco: Never  Vaping Use   Vaping Use: Never used  Substance Use Topics   Alcohol use: Yes    Comment: daily to occ   Drug use: No    Home Medications Prior to Admission medications   Medication Sig Start Date End Date Taking? Authorizing Provider  ALPRAZolam Duanne Moron) 1 MG tablet Take 0.5-1 mg by mouth 3 (three) times daily as needed for anxiety or sleep.     [provider]  diclofenac sodium (VOLTAREN) 1 % GEL APPLY 2 GRAMS TO AFFECTED AREA 4 TIMES A DAY 08/29/18   Leandrew Koyanagi, MD  DULoxetine (CYMBALTA) 60 MG capsule Take 60 mg by mouth every evening.     [provider]  fluticasone (FLONASE) 50 MCG/ACT nasal spray Place 2 sprays into the nose daily as needed for allergies.  For allergy symptoms    [provider]  Fluticasone-Salmeterol (ADVAIR) 250-50 MCG/DOSE AEPB Inhale 1 puff into the lungs daily as needed (asthma).    [provider]  ketotifen (ZADITOR) 0.025 % ophthalmic solution Place 2 drops into both eyes daily as needed (allergies).    [provider]  methocarbamol (ROBAXIN) 500 MG tablet Take 1 tablet (500 mg total) by mouth every 6 (six) hours as needed for muscle spasms. 03/20/13   Aundra Dubin, PA-C  olmesartan-hydrochlorothiazide (BENICAR HCT) 40-25 MG per tablet Take 1 tablet by mouth daily.    [provider]  omeprazole (PRILOSEC) 20 MG capsule Take 20 mg by mouth daily.     [provider]  ondansetron (ZOFRAN) 4 MG tablet Take 1 tablet (4 mg total) by mouth every 8 (eight) hours as needed for nausea or vomiting. 03/20/13    Aundra Dubin, PA-C  oxyCODONE-acetaminophen (PERCOCET/ROXICET) 5-325 MG per tablet Take 1-2 tablets by mouth every 4 (four) hours as needed for severe pain. 03/21/13   Aundra Dubin, PA-C  oxyCODONE-acetaminophen (ROXICET) 5-325 MG per tablet Take 1-2 tablets by mouth every 4 (four) hours as needed for severe pain. 03/20/13   Aundra Dubin, PA-C  predniSONE (STERAPRED UNI-PAK 21 TAB) 10 MG (21) TBPK tablet Take as directed 10/06/17   Leandrew Koyanagi, MD  traMADol (ULTRAM) 50 MG tablet Take 1 tablet (50 mg total) by mouth 2 (two) times daily as needed. 08/10/18   Aundra Dubin, PA-C  VALTREX 500 MG tablet Take 1 tablet (500 mg total) by mouth daily. 03/21/13   Aundra Dubin, PA-C    Allergies    Hydrocodone and Morphine and related  Review of Systems   Review of Systems  Constitutional:  Negative for chills, diaphoresis, fatigue and fever.  HENT:  Negative for congestion, facial swelling, rhinorrhea and sneezing.   Eyes: Negative.   Respiratory:  Negative for cough, chest tightness and shortness of breath.   Cardiovascular:  Negative for chest pain and leg swelling.  Gastrointestinal:  Negative for abdominal pain, blood in stool, diarrhea, nausea and vomiting.  Genitourinary:  Negative for difficulty urinating, flank pain, frequency and hematuria.  Musculoskeletal:  Negative for arthralgias and back pain.  Skin:  Negative for rash.  Neurological:  Positive for headaches. Negative for dizziness, speech difficulty, weakness and numbness.   Physical Exam Updated Vital Signs BP 129/83 (BP Location: Right Arm)   Pulse 78   Temp 98.3 F (36.8 C) (Oral)   Resp 16   Ht 5' (1.524 m)   Wt 64.4 kg   SpO2 99%   BMI 27.73 kg/m   Physical Exam Constitutional:      Appearance: She is well-developed.  HENT:     Head: Normocephalic and atraumatic.     Comments: Patient has some tenderness on palpation of the right TMJ area.  There is some very minimal tenderness around the right  lower back molar.  No swelling or inflammation.  No pain on opening closing her jaw.  No pain over her temporal artery.  No facial swelling is noted.  No rashes. Eyes:     Pupils: Pupils are equal, round, and reactive to light.  Cardiovascular:     Rate and Rhythm: Normal rate and regular rhythm.     Heart sounds: Normal heart sounds.  Pulmonary:     Effort: Pulmonary effort is normal. No respiratory distress.     Breath sounds: Normal breath sounds. No wheezing or  rales.  Chest:     Chest wall: No tenderness.  Abdominal:     General: Bowel sounds are normal.     Palpations: Abdomen is soft.     Tenderness: There is no abdominal tenderness. There is no guarding or rebound.  Musculoskeletal:        General: Normal range of motion.     Cervical back: Normal range of motion and neck supple.  Lymphadenopathy:     Cervical: No cervical adenopathy.  Skin:    General: Skin is warm and dry.     Findings: No rash.  Neurological:     General: No focal deficit present.     Mental Status: She is alert and oriented to person, place, and time.    ED Results / Procedures / Treatments   Labs (all labs ordered are listed, but only abnormal results are displayed) Labs Reviewed - No data to display  EKG EKG Interpretation  Date/Time:  Thursday October 29 2020 15:32:28 EDT Ventricular Rate:  90 PR Interval:  144 QRS Duration: 76 QT Interval:  370 QTC Calculation: 452 R Axis:   47 Text Interpretation: Sinus rhythm with Premature atrial complexes Otherwise normal ECG Confirmed by Malvin Johns [phone]) on 10/29/2020 5:52:49 PM  Radiology CT HEAD WO CONTRAST (5MM)  Result Date: 10/29/2020 CLINICAL DATA:  Headache, worsening EXAM: CT HEAD WITHOUT CONTRAST TECHNIQUE: Contiguous axial images were obtained from the base of the skull through the vertex without intravenous contrast. COMPARISON:  10/13/2020 FINDINGS: Brain: Overall unchanged hypodense collection along the right frontal convexity,  with previously noted portion along the right anterior temporal lobe no longer seen. No hyperdense portion to suggest acute on chronic hemorrhage. Previously noted hyperdense hemorrhage along the posterior falx is also no longer definitively seen. No acute infarct, cerebral edema, mass, mass effect, or midline shift. No hydrocephalus. No new extra-axial fluid collection. Vascular: No hyperdense vessel or unexpected calcification. Skull: Normal. Negative for fracture or focal lesion. Sinuses/Orbits: No acute finding. Other: The mastoids are well aerated. IMPRESSION: Unchanged hypodense collection along the right frontal convexity. No hyperdense element to suggest acute on chronic process. Electronically Signed   By: Merilyn Baba M.D.   On: 10/29/2020 17:30    Procedures Procedures   Medications Ordered in ED Medications  acetaminophen (TYLENOL) tablet 1,000 mg (1,000 mg Oral Not Given 10/29/20 1811)    ED Course  I have reviewed the triage vital signs and the nursing notes.  Pertinent labs & imaging results that were available during my care of the patient were reviewed by me and considered in my medical decision making (see chart for details).    MDM Rules/Calculators/A&P                           Patient is a 75 year old female who presents with sudden onset of sharp pain in her right face.  She described it as a headache but actually it was tender over her right TMJ area.  There is no swelling.  No deformity to the face.  No rash that would be more concerning for shingles.  She had some tenderness around her right lower back molar.  However there is no swelling, redness or drainage from the area.  Given her recent history of subdural hematoma, head CT was performed which showed no acute abnormality.  Her pain seems to be more localized to her right jaw.  I do not see any deformity or signs of infection.  It could be a little irritation of her TMJ.  She has no neurologic deficits.  She declined  the need for any pain medicine other than Tylenol.  She was discharged home in good condition.  She was encouraged to follow-up with her PCP tomorrow for recheck.  Return precautions were given. Final Clinical Impression(s) / ED Diagnoses Final diagnoses:  Jaw pain    Rx / DC Orders ED Discharge Orders     None        Malvin Johns, MD 10/29/20 1839

## 2020-10-29 NOTE — Discharge Instructions (Addendum)
Use Tylenol as needed for symptomatic relief.  Follow-up with your primary care doctor tomorrow for recheck.  Return here as needed if you have any worsening symptoms.

## 2020-11-05 ENCOUNTER — Ambulatory Visit
Admission: RE | Admit: 2020-11-05 | Discharge: 2020-11-05 | Disposition: A | Discharge status: Home / Self Care | Payer: Medicare Other | Source: Ambulatory Visit | Attending: Neurological Surgery | Admitting: Neurological Surgery

## 2020-11-05 DIAGNOSIS — S065XAA Traumatic subdural hemorrhage with loss of consciousness status unknown, initial encounter: Secondary | ICD-10-CM

## 2020-11-05 DIAGNOSIS — S065X9A Traumatic subdural hemorrhage with loss of consciousness of unspecified duration, initial encounter: Secondary | ICD-10-CM

## 2020-11-05 DIAGNOSIS — Z1231 Encounter for screening mammogram for malignant neoplasm of breast: Secondary | ICD-10-CM | POA: Diagnosis not present

## 2020-11-05 DIAGNOSIS — I6200 Nontraumatic subdural hemorrhage, unspecified: Secondary | ICD-10-CM | POA: Diagnosis not present

## 2020-11-19 DIAGNOSIS — R109 Unspecified abdominal pain: Secondary | ICD-10-CM | POA: Diagnosis not present

## 2020-11-20 ENCOUNTER — Inpatient Hospital Stay (HOSPITAL_COMMUNITY): Payer: Medicare Other

## 2020-11-20 ENCOUNTER — Emergency Department (HOSPITAL_BASED_OUTPATIENT_CLINIC_OR_DEPARTMENT_OTHER): Payer: Medicare Other | Admitting: Radiology

## 2020-11-20 ENCOUNTER — Emergency Department (HOSPITAL_BASED_OUTPATIENT_CLINIC_OR_DEPARTMENT_OTHER): Payer: Medicare Other

## 2020-11-20 ENCOUNTER — Inpatient Hospital Stay (HOSPITAL_BASED_OUTPATIENT_CLINIC_OR_DEPARTMENT_OTHER)
Admission: EM | Admit: 2020-11-20 | Discharge: 2020-11-26 | DRG: 083 | Disposition: A | Discharge status: Home / Self Care | Payer: Medicare Other | Attending: Neurological Surgery | Admitting: Neurological Surgery

## 2020-11-20 ENCOUNTER — Encounter (HOSPITAL_BASED_OUTPATIENT_CLINIC_OR_DEPARTMENT_OTHER): Payer: Self-pay | Admitting: Emergency Medicine

## 2020-11-20 ENCOUNTER — Other Ambulatory Visit: Payer: Self-pay

## 2020-11-20 DIAGNOSIS — Z20822 Contact with and (suspected) exposure to covid-19: Secondary | ICD-10-CM | POA: Diagnosis not present

## 2020-11-20 DIAGNOSIS — Z885 Allergy status to narcotic agent status: Secondary | ICD-10-CM | POA: Diagnosis not present

## 2020-11-20 DIAGNOSIS — K219 Gastro-esophageal reflux disease without esophagitis: Secondary | ICD-10-CM | POA: Diagnosis present

## 2020-11-20 DIAGNOSIS — W19XXXA Unspecified fall, initial encounter: Secondary | ICD-10-CM | POA: Diagnosis not present

## 2020-11-20 DIAGNOSIS — J302 Other seasonal allergic rhinitis: Secondary | ICD-10-CM | POA: Diagnosis present

## 2020-11-20 DIAGNOSIS — G4489 Other headache syndrome: Secondary | ICD-10-CM | POA: Diagnosis not present

## 2020-11-20 DIAGNOSIS — R110 Nausea: Secondary | ICD-10-CM | POA: Diagnosis not present

## 2020-11-20 DIAGNOSIS — Z23 Encounter for immunization: Secondary | ICD-10-CM

## 2020-11-20 DIAGNOSIS — Z79899 Other long term (current) drug therapy: Secondary | ICD-10-CM | POA: Diagnosis not present

## 2020-11-20 DIAGNOSIS — M1990 Unspecified osteoarthritis, unspecified site: Secondary | ICD-10-CM | POA: Diagnosis present

## 2020-11-20 DIAGNOSIS — S066X0A Traumatic subarachnoid hemorrhage without loss of consciousness, initial encounter: Secondary | ICD-10-CM | POA: Diagnosis not present

## 2020-11-20 DIAGNOSIS — F32A Depression, unspecified: Secondary | ICD-10-CM | POA: Diagnosis present

## 2020-11-20 DIAGNOSIS — Z043 Encounter for examination and observation following other accident: Secondary | ICD-10-CM | POA: Diagnosis not present

## 2020-11-20 DIAGNOSIS — Z96651 Presence of right artificial knee joint: Secondary | ICD-10-CM | POA: Diagnosis present

## 2020-11-20 DIAGNOSIS — I10 Essential (primary) hypertension: Secondary | ICD-10-CM | POA: Diagnosis not present

## 2020-11-20 DIAGNOSIS — S066X9A Traumatic subarachnoid hemorrhage with loss of consciousness of unspecified duration, initial encounter: Secondary | ICD-10-CM | POA: Diagnosis not present

## 2020-11-20 DIAGNOSIS — I629 Nontraumatic intracranial hemorrhage, unspecified: Secondary | ICD-10-CM | POA: Diagnosis not present

## 2020-11-20 DIAGNOSIS — F419 Anxiety disorder, unspecified: Secondary | ICD-10-CM | POA: Diagnosis not present

## 2020-11-20 DIAGNOSIS — R4182 Altered mental status, unspecified: Secondary | ICD-10-CM | POA: Diagnosis not present

## 2020-11-20 DIAGNOSIS — R296 Repeated falls: Secondary | ICD-10-CM | POA: Diagnosis present

## 2020-11-20 DIAGNOSIS — Z79891 Long term (current) use of opiate analgesic: Secondary | ICD-10-CM

## 2020-11-20 DIAGNOSIS — G910 Communicating hydrocephalus: Secondary | ICD-10-CM | POA: Diagnosis not present

## 2020-11-20 DIAGNOSIS — R410 Disorientation, unspecified: Secondary | ICD-10-CM | POA: Diagnosis present

## 2020-11-20 DIAGNOSIS — R42 Dizziness and giddiness: Secondary | ICD-10-CM | POA: Diagnosis not present

## 2020-11-20 DIAGNOSIS — I609 Nontraumatic subarachnoid hemorrhage, unspecified: Secondary | ICD-10-CM | POA: Diagnosis not present

## 2020-11-20 DIAGNOSIS — I608 Other nontraumatic subarachnoid hemorrhage: Secondary | ICD-10-CM

## 2020-11-20 DIAGNOSIS — S066XAA Traumatic subarachnoid hemorrhage with loss of consciousness status unknown, initial encounter: Principal | ICD-10-CM | POA: Diagnosis present

## 2020-11-20 DIAGNOSIS — Y93K1 Activity, walking an animal: Secondary | ICD-10-CM | POA: Diagnosis not present

## 2020-11-20 DIAGNOSIS — R400 Somnolence: Secondary | ICD-10-CM | POA: Diagnosis not present

## 2020-11-20 LAB — CBC WITH DIFFERENTIAL/PLATELET
Abs Immature Granulocytes: 0.04 10*3/uL (ref 0.00–0.07)
Basophils Absolute: 0.1 10*3/uL (ref 0.0–0.1)
Basophils Relative: 0 %
Eosinophils Absolute: 0 10*3/uL (ref 0.0–0.5)
Eosinophils Relative: 0 %
HCT: 40.4 % (ref 36.0–46.0)
Hemoglobin: 13.2 g/dL (ref 12.0–15.0)
Immature Granulocytes: 0 %
Lymphocytes Relative: 8 %
Lymphs Abs: 0.9 10*3/uL (ref 0.7–4.0)
MCH: 31.2 pg (ref 26.0–34.0)
MCHC: 32.7 g/dL (ref 30.0–36.0)
MCV: 95.5 fL (ref 80.0–100.0)
Monocytes Absolute: 0.4 10*3/uL (ref 0.1–1.0)
Monocytes Relative: 4 %
Neutro Abs: 10 10*3/uL — ABNORMAL HIGH (ref 1.7–7.7)
Neutrophils Relative %: 88 %
Platelets: 299 10*3/uL (ref 150–400)
RBC: 4.23 MIL/uL (ref 3.87–5.11)
RDW: 12.9 % (ref 11.5–15.5)
WBC: 11.4 10*3/uL — ABNORMAL HIGH (ref 4.0–10.5)
nRBC: 0 % (ref 0.0–0.2)

## 2020-11-20 LAB — COMPREHENSIVE METABOLIC PANEL
ALT: 14 U/L (ref 0–44)
AST: 19 U/L (ref 15–41)
Albumin: 4.3 g/dL (ref 3.5–5.0)
Alkaline Phosphatase: 61 U/L (ref 38–126)
Anion gap: 12 (ref 5–15)
BUN: 16 mg/dL (ref 8–23)
CO2: 23 mmol/L (ref 22–32)
Calcium: 9.4 mg/dL (ref 8.9–10.3)
Chloride: 101 mmol/L (ref 98–111)
Creatinine, Ser: 0.57 mg/dL (ref 0.44–1.00)
GFR, Estimated: >60 mL/min (ref >60)
Glucose, Bld: 113 mg/dL — ABNORMAL HIGH (ref 70–99)
Potassium: 3.7 mmol/L (ref 3.5–5.1)
Sodium: 136 mmol/L (ref 135–145)
Total Bilirubin: 0.5 mg/dL (ref 0.3–1.2)
Total Protein: 7.1 g/dL (ref 6.5–8.1)

## 2020-11-20 LAB — URINALYSIS, COMPLETE (UACMP) WITH MICROSCOPIC
Bilirubin Urine: NEGATIVE
Glucose, UA: NEGATIVE mg/dL
Hgb urine dipstick: NEGATIVE
Ketones, ur: 15 mg/dL — AB
Leukocytes,Ua: NEGATIVE
Nitrite: NEGATIVE
Specific Gravity, Urine: 1.011 (ref 1.005–1.030)
pH: 7 (ref 5.0–8.0)

## 2020-11-20 LAB — ECHOCARDIOGRAM COMPLETE
Area-P 1/2: 3.03 cm2
Height: 60 in
S' Lateral: 2.1 cm
Weight: 2272 oz

## 2020-11-20 LAB — RESP PANEL BY RT-PCR (FLU A&B, COVID) ARPGX2
Influenza A by PCR: NEGATIVE
Influenza B by PCR: NEGATIVE
SARS Coronavirus 2 by RT PCR: NEGATIVE

## 2020-11-20 LAB — TROPONIN I (HIGH SENSITIVITY)
Troponin I (High Sensitivity): 13 ng/L (ref <18)
Troponin I (High Sensitivity): 13 ng/L (ref <18)

## 2020-11-20 LAB — MRSA NEXT GEN BY PCR, NASAL: MRSA by PCR Next Gen: NOT DETECTED

## 2020-11-20 LAB — CBG MONITORING, ED: Glucose-Capillary: 118 mg/dL — ABNORMAL HIGH (ref 70–99)

## 2020-11-20 LAB — LIPASE, BLOOD: Lipase: 12 U/L (ref 11–51)

## 2020-11-20 MED ORDER — METHOCARBAMOL 1000 MG/10ML IJ SOLN
500.0000 mg | Freq: Four times a day (QID) | INTRAVENOUS | Status: DC | PRN
  Administered 2020-11-21: 500 mg via INTRAVENOUS
  Filled 2020-11-20 (×3): qty 5

## 2020-11-20 MED ORDER — STROKE: EARLY STAGES OF RECOVERY BOOK
Freq: Once | Status: AC
  Filled 2020-11-20: qty 1

## 2020-11-20 MED ORDER — CHLORHEXIDINE GLUCONATE CLOTH 2 % EX PADS
6.0000 | MEDICATED_PAD | Freq: Every day | CUTANEOUS | Status: DC
  Administered 2020-11-20 – 2020-11-25 (×6): 6 via TOPICAL

## 2020-11-20 MED ORDER — ONDANSETRON HCL 4 MG/2ML IJ SOLN
4.0000 mg | Freq: Once | INTRAMUSCULAR | Status: AC
  Administered 2020-11-20: 4 mg via INTRAVENOUS
  Filled 2020-11-20: qty 2

## 2020-11-20 MED ORDER — ACETAMINOPHEN 650 MG RE SUPP: 650.0000 mg | Freq: Four times a day (QID) | RECTAL | Status: DC | PRN

## 2020-11-20 MED ORDER — LABETALOL HCL 5 MG/ML IV SOLN
20.0000 mg | Freq: Once | INTRAVENOUS | Status: AC
  Administered 2020-11-20: 20 mg via INTRAVENOUS
  Filled 2020-11-20: qty 4

## 2020-11-20 MED ORDER — FENTANYL CITRATE PF 50 MCG/ML IJ SOSY
25.0000 ug | PREFILLED_SYRINGE | INTRAMUSCULAR | Status: DC | PRN
  Administered 2020-11-20 – 2020-11-22 (×7): 25 ug via INTRAVENOUS
  Filled 2020-11-20 (×7): qty 1

## 2020-11-20 MED ORDER — ONDANSETRON HCL 4 MG PO TABS
4.0000 mg | ORAL_TABLET | Freq: Four times a day (QID) | ORAL | Status: DC | PRN
  Administered 2020-11-21: 4 mg via ORAL
  Filled 2020-11-20: qty 1

## 2020-11-20 MED ORDER — LABETALOL HCL 5 MG/ML IV SOLN
5.0000 mg | Freq: Once | INTRAVENOUS | Status: AC
  Administered 2020-11-20: 5 mg via INTRAVENOUS
  Filled 2020-11-20: qty 4

## 2020-11-20 MED ORDER — DOCUSATE SODIUM 100 MG PO CAPS
100.0000 mg | ORAL_CAPSULE | Freq: Two times a day (BID) | ORAL | Status: DC
  Administered 2020-11-20 – 2020-11-25 (×3): 100 mg via ORAL
  Filled 2020-11-20 (×7): qty 1

## 2020-11-20 MED ORDER — IOHEXOL 350 MG/ML SOLN
100.0000 mL | Freq: Once | INTRAVENOUS | Status: AC | PRN
  Administered 2020-11-20: 75 mL via INTRAVENOUS

## 2020-11-20 MED ORDER — FLEET ENEMA 7-19 GM/118ML RE ENEM: 1.0000 | ENEMA | Freq: Once | RECTAL | Status: DC | PRN

## 2020-11-20 MED ORDER — CLEVIDIPINE BUTYRATE 0.5 MG/ML IV EMUL
0.0000 mg/h | INTRAVENOUS | Status: DC
  Administered 2020-11-20: 1 mg/h via INTRAVENOUS
  Filled 2020-11-20 (×2): qty 50

## 2020-11-20 MED ORDER — ACETAMINOPHEN 325 MG PO TABS
650.0000 mg | ORAL_TABLET | Freq: Four times a day (QID) | ORAL | Status: DC | PRN
  Administered 2020-11-20 – 2020-11-26 (×13): 650 mg via ORAL
  Filled 2020-11-20 (×13): qty 2

## 2020-11-20 MED ORDER — LACTATED RINGERS IV BOLUS
1000.0000 mL | Freq: Once | INTRAVENOUS | Status: AC
  Administered 2020-11-20: 1000 mL via INTRAVENOUS

## 2020-11-20 MED ORDER — ONDANSETRON HCL 4 MG/2ML IJ SOLN
4.0000 mg | Freq: Four times a day (QID) | INTRAMUSCULAR | Status: DC | PRN
  Administered 2020-11-21: 4 mg via INTRAVENOUS
  Filled 2020-11-20: qty 2

## 2020-11-20 MED ORDER — SODIUM CHLORIDE 0.9 % IV SOLN: INTRAVENOUS | Status: DC

## 2020-11-20 MED ORDER — POLYETHYLENE GLYCOL 3350 17 G PO PACK: 17.0000 g | PACK | Freq: Every day | ORAL | Status: DC | PRN

## 2020-11-20 MED ORDER — NIMODIPINE 30 MG PO CAPS
60.0000 mg | ORAL_CAPSULE | ORAL | Status: DC
  Administered 2020-11-20 – 2020-11-26 (×36): 60 mg via ORAL
  Filled 2020-11-20 (×36): qty 2

## 2020-11-20 MED ORDER — BISACODYL 10 MG RE SUPP: 10.0000 mg | Freq: Every day | RECTAL | Status: DC | PRN

## 2020-11-20 MED ORDER — NIMODIPINE 6 MG/ML PO SOLN: 60.0000 mg | ORAL | Status: DC

## 2020-11-20 NOTE — Progress Notes (Signed)
Patient arrived to ICU with current belongings: clothes, shoes, 2 cell phones, glasses, 3 rings, and wallet. Wallet confirmed to have $40 in cash and multiple cards. Patient refused to have it stored in hospital safe. She stated she "had recently found $300, but probably took it out of my pocket". Unable to locate any additional bills/money in her belongings bags at bedside.

## 2020-11-20 NOTE — ED Provider Notes (Signed)
[facility] EMERGENCY DEPT Provider Note   CSN: [id number] Arrival date & time: 11/20/20  0305     History Chief Complaint  Patient presents with   Fall   Jaw Pain    Melissa Wilkinson is a 75 y.o. female.  75 year old female brought in by EMS for unclear reasons.  Patient states that she fell and has some jaw pain but on further history is becomes quite obvious that she is very confused.  She is disoriented to situation and she is disoriented to time.     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    Past Surgical History:  Procedure Laterality Date   APPENDECTOMY     CESAREAN SECTION     x2   COLONOSCOPY     DILATION AND CURETTAGE OF UTERUS     KNEE ARTHROSCOPY  1989   left   KNEE ARTHROSCOPY WITH MEDIAL MENISECTOMY Right 09/13/2012   Procedure: RIGHT KNEE ARTHROSCOPY WITH PARTIAL MEDIAL LATERAL MENISECTOMY CHRONDROPLASTY;  Surgeon: Ninetta Lights, MD;  Location: [facility];  Service: Orthopedics;  Laterality: Right;   TOTAL KNEE ARTHROPLASTY Right 03/20/2013   DR MURPHY   TOTAL KNEE ARTHROPLASTY Right 03/20/2013   Procedure: RIGHT TOTAL KNEE ARTHROPLASTY;  Surgeon: Ninetta Lights, MD;  Location: [facility];  Service: Orthopedics;  Laterality: Right;     OB History   No obstetric history on file.     History reviewed. No pertinent family history.  Social History   Tobacco Use   Smoking status: Never   Smokeless tobacco: Never  Vaping Use   Vaping Use: Never used  Substance Use Topics   Alcohol use: Yes    Comment: daily to occ   Drug use: No     Home Medications Prior to Admission medications   Medication Sig Start Date End Date Taking? Authorizing Provider  ALPRAZolam Duanne Moron) 1 MG tablet Take 0.5-1 mg by mouth 3 (three) times daily as needed for anxiety or sleep.     [provider]  diclofenac sodium (VOLTAREN) 1 % GEL APPLY 2 GRAMS TO AFFECTED AREA 4 TIMES A DAY 08/29/18   Leandrew Koyanagi, MD  DULoxetine (CYMBALTA) 60 MG capsule Take 60 mg by mouth every evening.     [provider]  fluticasone (FLONASE) 50 MCG/ACT nasal spray Place 2 sprays into the nose daily as needed for allergies. For allergy symptoms    [provider]  Fluticasone-Salmeterol (ADVAIR) 250-50 MCG/DOSE AEPB Inhale 1 puff into the lungs daily as needed (asthma).    [provider]  ketotifen (ZADITOR) 0.025 % ophthalmic solution Place 2 drops into both eyes daily as needed (allergies).    [provider]  methocarbamol (ROBAXIN) 500 MG tablet Take 1 tablet (500 mg total) by mouth every 6 (six) hours as needed for muscle spasms. 03/20/13   Aundra Dubin, PA-C  olmesartan-hydrochlorothiazide (BENICAR HCT) 40-25 MG per tablet Take 1 tablet by mouth daily.    [provider]  omeprazole (PRILOSEC) 20 MG capsule Take 20 mg by mouth daily.     [provider]  ondansetron (ZOFRAN) 4 MG tablet Take 1 tablet (4 mg total) by mouth every 8 (eight) hours as needed for nausea or vomiting. 03/20/13   Aundra Dubin, PA-C  oxyCODONE-acetaminophen (PERCOCET/ROXICET) 5-325 MG per tablet Take 1-2 tablets by mouth every 4 (four) hours as needed for severe pain. 03/21/13   Aundra Dubin, PA-C  oxyCODONE-acetaminophen (ROXICET) 5-325 MG per tablet Take 1-2 tablets by mouth every 4 (four) hours as needed for severe pain. 03/20/13   Aundra Dubin, PA-C  predniSONE (STERAPRED UNI-PAK 21 TAB) 10 MG (21) TBPK tablet Take as directed 10/06/17   Leandrew Koyanagi, MD  traMADol (ULTRAM) 50 MG tablet Take 1 tablet (50 mg total) by  mouth 2 (two) times daily as needed. 08/10/18   Aundra Dubin, PA-C  VALTREX 500 MG tablet Take 1 tablet (500 mg total) by mouth daily. 03/21/13   Aundra Dubin, PA-C    Allergies    Hydrocodone and Morphine and related  Review of Systems   Review of Systems  Unable to perform ROS: Mental status change   Physical Exam Updated Vital Signs BP (!) 153/89   Pulse 91   Temp 98 F (36.7 C) (Oral)   Resp (!) 22   Ht 5' (1.524 m)   Wt 64.4 kg   SpO2 98%   BMI 27.73 kg/m   Physical Exam Vitals and nursing note reviewed.  Constitutional:      Appearance: She is well-developed.  HENT:     Head: Normocephalic and atraumatic.     Mouth/Throat:     Mouth: Mucous membranes are moist.     Pharynx: Oropharynx is clear.  Eyes:     Pupils: Pupils are equal, round, and reactive to light.  Cardiovascular:     Rate and Rhythm: Normal rate and regular rhythm.  Pulmonary:     Effort: No respiratory distress.     Breath sounds: No stridor.  Abdominal:     General: There is no distension.  Musculoskeletal:        General: No swelling or tenderness. Normal range of motion.     Cervical back: Normal range of motion.  Skin:    General: Skin is warm and dry.  Neurological:     General: No focal deficit present.     Mental Status: She is alert.     Comments: Moving all extremities.  Pupils equal round reactive to light.  No facial droop.  She is disoriented to situation, time.  Oriented to self.    ED Results / Procedures / Treatments   Labs (all labs ordered are listed, but only abnormal results are displayed) Labs Reviewed  COMPREHENSIVE METABOLIC PANEL - Abnormal; Notable for the following components:      Result Value   Glucose, Bld 113 (*)    All other components within normal limits  CBC WITH DIFFERENTIAL/PLATELET - Abnormal; Notable for the following components:   WBC 11.4 (*)    Neutro Abs 10.0 (*)    All other components within normal limits  CBG MONITORING, ED -  Abnormal; Notable for the following components:   Glucose-Capillary 118 (*)    All other components within normal limits  SARS CORONAVIRUS 2 (TAT 6-24 HRS)  LIPASE, BLOOD  URINALYSIS, COMPLETE (UACMP) WITH MICROSCOPIC  TROPONIN I (HIGH SENSITIVITY)    EKG None  Radiology DG Chest 2 View  Result Date: 11/20/2020 CLINICAL DATA:  Altered mental status EXAM: CHEST - 2 VIEW COMPARISON:  03/14/2013 FINDINGS: Normal heart size and aortic contours. Probable small hiatal hernia. No acute infiltrate or edema. No effusion or pneumothorax. T12 compression fracture with moderate anterior height loss, new since 2015, age-indeterminate. IMPRESSION: No active cardiopulmonary disease. T12 compression fracture since 2015. Electronically Signed   By: Jorje Guild M.D.   On: 11/20/2020 04:08   DG Pelvis 1-2 Views  Result Date: 11/20/2020 CLINICAL DATA:  Fall yesterday landing on bottom EXAM: PELVIS - 1 VIEW; SACRUM AND COCCYX - 3 VIEW COMPARISON:  None. FINDINGS: No evidence of hip, pelvic ring, or sacral fracture. Left hip osteoarthritis with joint narrowing and spurring. There could be gluteal subcutaneous contusion on the lateral view. IMPRESSION: No acute fracture or diastasis. Electronically Signed   By: Jorje Guild M.D.   On: 11/20/2020 04:14   DG Sacrum/Coccyx  Result Date: 11/20/2020 CLINICAL DATA:  Fall yesterday landing on bottom EXAM: PELVIS - 1 VIEW; SACRUM AND COCCYX - 3 VIEW COMPARISON:  None. FINDINGS: No evidence of hip, pelvic ring, or sacral fracture. Left hip osteoarthritis with joint narrowing and spurring. There could be gluteal subcutaneous contusion on the lateral view. IMPRESSION: No acute fracture or diastasis. Electronically Signed   By: Jorje Guild M.D.   On: 11/20/2020 04:14   CT HEAD WO CONTRAST  Result Date: 11/20/2020 CLINICAL DATA:  Fall EXAM: CT HEAD WITHOUT CONTRAST TECHNIQUE: Contiguous axial images were obtained from the base of the skull through the vertex  without intravenous contrast. COMPARISON:  None. FINDINGS: Brain: There is subarachnoid hemorrhage within the interhemispheric fissure, basal cisterns and fourth ventricle. Generalized volume loss with changes of chronic microvascular ischemia. Compared to 11/05/2020, the lateral and third ventricles are dilated, indicating early communicating hydrocephalus. Vascular: No abnormal hyperdensity of the major intracranial arteries or dural venous sinuses. No intracranial atherosclerosis. Skull: The visualized skull base, calvarium and extracranial soft tissues are normal. Sinuses/Orbits: No fluid levels or advanced mucosal thickening of the visualized paranasal sinuses. No mastoid or middle ear effusion. The orbits are normal. IMPRESSION: 1. Subarachnoid hemorrhage within the interhemispheric fissure, basal cisterns and fourth ventricle. Given the history of recent fall and the absence of aneurysm on CTA head of 01/11/20, this is favored to be traumatic. 2. Early communicating hydrocephalus. Critical Value/emergent results were called by telephone at the time of interpretation on 11/20/2020 at 3:51 am to provider [facility] , who verbally acknowledged these results. Electronically Signed   By: Ulyses Jarred M.D.   On: 11/20/2020 03:53    Procedures Procedures   Medications Ordered in ED Medications  lactated ringers bolus 1,000 mL (1,000 mLs Intravenous New Bag/Given 11/20/20 0416)  ondansetron (ZOFRAN) injection 4 mg (4 mg Intravenous Given 11/20/20 0415)  labetalol (NORMODYNE) injection 5 mg (5 mg Intravenous Given 11/20/20 0420)    ED Course  I have reviewed the triage vital signs and the nursing notes.  Pertinent labs & imaging results that were available during my care of the patient were reviewed by me and considered in my medical decision making (see chart for details).    MDM Rules/Calculators/A&P                         Patient found to have subarachnoid hemorrhage.  Thought to be likely  traumatic secondary to no aneurysms.  Discussed with neurosurgery and bed request obtained.  Labetalol given for hypertension.  Other than that  she is remained below 427 systolic.  Final Clinical Impression(s) / ED Diagnoses Final diagnoses:  None    Rx / DC Orders ED Discharge Orders     None        Cephas Revard, Corene Cornea, MD 11/20/20 [phone]

## 2020-11-20 NOTE — ED Triage Notes (Signed)
   Patient BIB EMS for jaw pain that has been off and on.  Patient also states that she fell while walking her dog yesterday, and fell approximately 2 steps landing on her bottom.  Patient states she did not hit her head.  Patient is confused and repeating same statements over and over.  Was seen earlier this month for dizziness.  No complaints of pain at this time.

## 2020-11-20 NOTE — ED Notes (Signed)
Patient transported to CT 

## 2020-11-20 NOTE — Evaluation (Signed)
Speech Language Pathology Evaluation Patient Details Name: Melissa Wilkinson MRN: [id number] DOB: 1945-12-22 Today's Date: 11/20/2020 Time: 4481-8563 SLP Time Calculation (min) (ACUTE ONLY): 20 min  Problem List:  Patient Active Problem List   Diagnosis Date Noted   SAH (subarachnoid hemorrhage) ([place]) 11/20/2020   Subarachnoid hemorrhage ([place]) 11/20/2020   DJD (degenerative joint disease) of knee 03/20/2013   Past Medical History:  Past Medical History:  Diagnosis Date   Anxiety    Arthritis    Blood dyscrasia    family history of c protien deficiency    Complication of anesthesia    difficult waking after c section   Depression    GERD (gastroesophageal reflux disease)    Hypertension    Seasonal allergies    Wears glasses    Past Surgical History:  Past Surgical History:  Procedure Laterality Date   APPENDECTOMY     CESAREAN SECTION     x2   COLONOSCOPY     DILATION AND CURETTAGE OF UTERUS     KNEE ARTHROSCOPY  1989   left   KNEE ARTHROSCOPY WITH MEDIAL MENISECTOMY Right 09/13/2012   Procedure: RIGHT KNEE ARTHROSCOPY WITH PARTIAL MEDIAL LATERAL MENISECTOMY CHRONDROPLASTY;  Surgeon: Ninetta Lights, MD;  Location: [facility];  Service: Orthopedics;  Laterality: Right;   TOTAL KNEE ARTHROPLASTY Right 03/20/2013   DR MURPHY   TOTAL KNEE ARTHROPLASTY Right 03/20/2013   Procedure: RIGHT TOTAL KNEE ARTHROPLASTY;  Surgeon: Ninetta Lights, MD;  Location: [facility];  Service: Orthopedics;  Laterality: Right;   HPI:  75 year old female brought in by EMS for unclear reasons. CT revealed suffering small volume subarachnoid hemorrhage in the interhemispheric fissure, basilar cisterns, and fourth ventricle atient Per chart pt states that she fell and has some jaw pain. Per CT traumatic hemorrhage is suspected. PMH: GERD, HTN, anxiety. Husband passed away recently.   Assessment / Plan / Recommendation Clinical Impression  Pt given parts of the Cognistat where she  scored within average range verbal skills that appear greater than her functional execution. Difficulty retrieving 4 words but accurate with semantic cue. Therapist suspects when she is prsented with more complex increased length information she may have difficulty accurately integrating simultaneously. She lives alone and is responsible for all household activieis. Recommend ST for compensatory strategies.    SLP Assessment  SLP Visit Diagnosis: Cognitive communication deficit (R41.841)    Recommendations for follow up therapy are one component of a multi-disciplinary discharge planning process, led by the attending physician.  Recommendations may be updated based on patient status, additional functional criteria and insurance authorization.    Follow Up Recommendations  Inpatient Rehab (?)    Frequency and Duration min 1 x/week  2 weeks      SLP Evaluation Cognition  Overall Cognitive Status: Impaired/Different from baseline Arousal/Alertness: Awake/alert Orientation Level: Oriented X4 Attention: Sustained Sustained Attention: Appears intact Memory: Impaired Memory Impairment: Retrieval deficit Awareness: Impaired Awareness Impairment: Anticipatory impairment Problem Solving:  (verbal adequate, suspect difficulty w/ complex tasks) Safety/Judgment: Impaired       Comprehension  Auditory Comprehension Overall Auditory Comprehension: Appears within functional limits for tasks assessed Visual Recognition/Discrimination Discrimination: Not tested Reading Comprehension Reading Status: Not tested    Expression Expression Primary Mode of Expression: Verbal Verbal Expression Overall Verbal Expression: Appears within functional limits for tasks assessed Written Expression Dominant Hand: Right Written Expression: Not tested   Oral / Motor  Oral Motor/Sensory Function Overall Oral Motor/Sensory Function: Within functional limits Motor Speech Overall Motor  Speech: Appears within  functional limits for tasks assessed Motor Planning: Witnin functional limits   GO                    Houston Siren 11/20/2020, 5:04 PM

## 2020-11-20 NOTE — ED Notes (Signed)
Report called to [facility] for pt transfer.  Pt resting and alert in bed at present.  Vital signs stable

## 2020-11-20 NOTE — ED Notes (Signed)
[phone] jennifer (neighbor ) -  for updates

## 2020-11-20 NOTE — ED Notes (Signed)
Pt's neighbor, Joetta Manners - phone [phone]

## 2020-11-20 NOTE — ED Notes (Signed)
Pt's son Legrand Como lives in [place], international phone number [phone]

## 2020-11-20 NOTE — ED Notes (Signed)
Attempted to call report to inpatient RN, RN not available to receive report at this time.

## 2020-11-20 NOTE — H&P (Addendum)
Chief Complaint: SAH HPI: Melissa Wilkinson is a 75 y.o. female with a PmHx of HTN, anxiety, depression, and GERD, who was BIB EMS to the [place] ED for unclear reasons. She reports that she has recently had intermittent jaw pain as well as a fall yesterday while walking her dog. The patient is a poor historian due to confusion. Fall was reported to be off of two steps and she denied hitting her head. CT head and CTA head was obtained while in the ED and revealed a subarachnoid hemorrhage within the interhemispheric fissure, basal cisterns and fourth ventricle with early communicating hydrocephalus. CTA head was negative for aneurysm or other new abnormality. Neurosurgery was called to evaluate the patient.   I updated the patient's son who lives in the [place]. Per the patient's son, she has been having balance difficulties for multiple months and has had multiple falls. Her husband is recently deceased and her son feels this has been a big stressor to the patient and has been dealing with depression since his death.   Past Medical History:  Diagnosis Date   Anxiety    Arthritis    Blood dyscrasia    family history of c protien deficiency    Complication of anesthesia    difficult waking after c section   Depression    GERD (gastroesophageal reflux disease)    Hypertension    Seasonal allergies    Wears glasses     Past Surgical History:  Procedure Laterality Date   APPENDECTOMY     CESAREAN SECTION     x2   COLONOSCOPY     DILATION AND CURETTAGE OF UTERUS     KNEE ARTHROSCOPY  1989   left   KNEE ARTHROSCOPY WITH MEDIAL MENISECTOMY Right 09/13/2012   Procedure: RIGHT KNEE ARTHROSCOPY WITH PARTIAL MEDIAL LATERAL MENISECTOMY CHRONDROPLASTY;  Surgeon: Ninetta Lights, MD;  Location: [facility];  Service: Orthopedics;  Laterality: Right;   TOTAL KNEE ARTHROPLASTY Right 03/20/2013   DR MURPHY   TOTAL KNEE ARTHROPLASTY Right 03/20/2013   Procedure: RIGHT  TOTAL KNEE ARTHROPLASTY;  Surgeon: Ninetta Lights, MD;  Location: [facility];  Service: Orthopedics;  Laterality: Right;    History reviewed. No pertinent family history. Social History:  reports that she has never smoked. She has never used smokeless tobacco. She reports current alcohol use. She reports that she does not use drugs.  Allergies:  Allergies  Allergen Reactions   Hydrocodone Nausea And Vomiting   Morphine And Related Hives and Other (See Comments)    Fever     (Not in a hospital admission)   Results for orders placed or performed during the hospital encounter of 11/20/20 (from the past 48 hour(s))  CBG monitoring, ED     Status: Abnormal   Collection Time: 11/20/20  3:29 AM  Result Value Ref Range   Glucose-Capillary 118 (H) 70 - 99 mg/dL    Comment: Glucose reference range applies only to samples taken after fasting for at least 8 hours.  Comprehensive metabolic panel     Status: Abnormal   Collection Time: 11/20/20  3:30 AM  Result Value Ref Range   Sodium 136 135 - 145 mmol/L   Potassium 3.7 3.5 - 5.1 mmol/L   Chloride 101 98 - 111 mmol/L   CO2 23 22 - 32 mmol/L   Glucose, Bld 113 (H) 70 - 99 mg/dL    Comment: Glucose reference range applies only to samples taken after fasting for  at least 8 hours.   BUN 16 8 - 23 mg/dL   Creatinine, Ser 0.57 0.44 - 1.00 mg/dL   Calcium 9.4 8.9 - 10.3 mg/dL   Total Protein 7.1 6.5 - 8.1 g/dL   Albumin 4.3 3.5 - 5.0 g/dL   AST 19 15 - 41 U/L   ALT 14 0 - 44 U/L   Alkaline Phosphatase 61 38 - 126 U/L   Total Bilirubin 0.5 0.3 - 1.2 mg/dL   GFR, Estimated >60 >60 mL/min    Comment: (NOTE) Calculated using the CKD-EPI Creatinine Equation (2021)    Anion gap 12 5 - 15    Comment: Performed at [facility], [address] [place], [place] [address]  CBC WITH DIFFERENTIAL     Status: Abnormal   Collection Time: 11/20/20  3:30 AM  Result Value Ref Range   WBC 11.4 (H) 4.0 - 10.5 K/uL   RBC 4.23 3.87 - 5.11  MIL/uL   Hemoglobin 13.2 12.0 - 15.0 g/dL   HCT 40.4 36.0 - 46.0 %   MCV 95.5 80.0 - 100.0 fL   MCH 31.2 26.0 - 34.0 pg   MCHC 32.7 30.0 - 36.0 g/dL   RDW 12.9 11.5 - 15.5 %   Platelets 299 150 - 400 K/uL   nRBC 0.0 0.0 - 0.2 %   Neutrophils Relative % 88 %   Neutro Abs 10.0 (H) 1.7 - 7.7 K/uL   Lymphocytes Relative 8 %   Lymphs Abs 0.9 0.7 - 4.0 K/uL   Monocytes Relative 4 %   Monocytes Absolute 0.4 0.1 - 1.0 K/uL   Eosinophils Relative 0 %   Eosinophils Absolute 0.0 0.0 - 0.5 K/uL   Basophils Relative 0 %   Basophils Absolute 0.1 0.0 - 0.1 K/uL   Immature Granulocytes 0 %   Abs Immature Granulocytes 0.04 0.00 - 0.07 K/uL    Comment: Performed at [facility], [address], [place], [place] [address]  Lipase, blood     Status: None   Collection Time: 11/20/20  3:30 AM  Result Value Ref Range   Lipase 12 11 - 51 U/L    Comment: Performed at [facility], [address] [place], [place] [address]  Troponin I (High Sensitivity)     Status: None   Collection Time: 11/20/20  3:30 AM  Result Value Ref Range   Troponin I (High Sensitivity) 13 <18 ng/L    Comment: (NOTE) Elevated high sensitivity troponin I (hsTnI) values and significant  changes across serial measurements may suggest ACS but many other  chronic and acute conditions are known to elevate hsTnI results.  Refer to the "Links" section for chest pain algorithms and additional  guidance. Performed at [facility], [address] [place], [place] [address]   Urinalysis, Complete w Microscopic     Status: Abnormal   Collection Time: 11/20/20  5:06 AM  Result Value Ref Range   Color, Urine YELLOW YELLOW   APPearance CLEAR CLEAR   Specific Gravity, Urine 1.011 1.005 - 1.030   pH 7.0 5.0 - 8.0   Glucose, UA NEGATIVE NEGATIVE mg/dL   Hgb urine dipstick NEGATIVE NEGATIVE   Bilirubin Urine NEGATIVE NEGATIVE   Ketones, ur 15 (A) NEGATIVE mg/dL   Protein, ur  TRACE (A) NEGATIVE mg/dL   Nitrite NEGATIVE NEGATIVE   Leukocytes,Ua NEGATIVE NEGATIVE   RBC / HPF 0-5 0 - 5 RBC/hpf   WBC, UA 0-5 0 - 5 WBC/hpf   Squamous  Epithelial / LPF 0-5 0 - 5   Mucus PRESENT     Comment: Performed at [facility], [address], [place], [place] [address]  Resp Panel by RT-PCR (Flu A&B, Covid) Nasopharyngeal Swab     Status: None   Collection Time: 11/20/20  5:06 AM   Specimen: Nasopharyngeal Swab; Nasopharyngeal(NP) swabs in vial transport medium  Result Value Ref Range   SARS Coronavirus 2 by RT PCR NEGATIVE NEGATIVE    Comment: (NOTE) SARS-CoV-2 target nucleic acids are NOT DETECTED.  The SARS-CoV-2 RNA is generally detectable in upper respiratory specimens during the acute phase of infection. The lowest concentration of SARS-CoV-2 viral copies this assay can detect is 138 copies/mL. A negative result does not preclude SARS-Cov-2 infection and should not be used as the sole basis for treatment or other patient management decisions. A negative result may occur with  improper specimen collection/handling, submission of specimen other than nasopharyngeal swab, presence of viral mutation(s) within the areas targeted by this assay, and inadequate number of viral copies(<138 copies/mL). A negative result must be combined with clinical observations, patient history, and epidemiological information. The expected result is Negative.  Fact Sheet for Patients:  EntrepreneurPulse.com.au  Fact Sheet for Healthcare Providers:  IncredibleEmployment.be  This test is no t yet approved or cleared by the [place] FDA and  has been authorized for detection and/or diagnosis of SARS-CoV-2 by FDA under an Emergency Use Authorization (EUA). This EUA will remain  in effect (meaning this test can be used) for the duration of the COVID-19 declaration under Section 564(b)(1) of the Act, 21 U.S.C.section  360bbb-3(b)(1), unless the authorization is terminated  or revoked sooner.       Influenza A by PCR NEGATIVE NEGATIVE   Influenza B by PCR NEGATIVE NEGATIVE    Comment: (NOTE) The Xpert Xpress SARS-CoV-2/FLU/RSV plus assay is intended as an aid in the diagnosis of influenza from Nasopharyngeal swab specimens and should not be used as a sole basis for treatment. Nasal washings and aspirates are unacceptable for Xpert Xpress SARS-CoV-2/FLU/RSV testing.  Fact Sheet for Patients: EntrepreneurPulse.com.au  Fact Sheet for Healthcare Providers: IncredibleEmployment.be  This test is not yet approved or cleared by the [place] FDA and has been authorized for detection and/or diagnosis of SARS-CoV-2 by FDA under an Emergency Use Authorization (EUA). This EUA will remain in effect (meaning this test can be used) for the duration of the COVID-19 declaration under Section 564(b)(1) of the Act, 21 U.S.C. section 360bbb-3(b)(1), unless the authorization is terminated or revoked.  Performed at [facility], [place], [place] [address]   Troponin I (High Sensitivity)     Status: None   Collection Time: 11/20/20  5:23 AM  Result Value Ref Range   Troponin I (High Sensitivity) 13 <18 ng/L    Comment: (NOTE) Elevated high sensitivity troponin I (hsTnI) values and significant  changes across serial measurements may suggest ACS but many other  chronic and acute conditions are known to elevate hsTnI results.  Refer to the "Links" section for chest pain algorithms and additional  guidance. Performed at [facility], [address], [place], [place] [address]    CT Angio Head W or Wo Contrast  Result Date: 11/20/2020 CLINICAL DATA:  Subarachnoid hemorrhage EXAM: CT ANGIOGRAPHY HEAD TECHNIQUE: Multidetector CT imaging of the head was performed using the standard protocol during bolus administration of  intravenous contrast. Multiplanar CT image reconstructions and MIPs were obtained to evaluate the vascular anatomy.  CONTRAST:  71mL OMNIPAQUE IOHEXOL 350 MG/ML SOLN COMPARISON:  01/11/2020 FINDINGS: Anterior circulation: Intracranial internal carotid arteries are patent. Anterior and middle cerebral arteries are patent. Posterior circulation: Intracranial vertebral arteries, basilar artery, and posterior cerebral arteries are patent. Venous sinuses: Not well evaluated. Anatomic variants: None significant Review of the MIP images confirms the above findings. IMPRESSION: No aneurysm or other new abnormality. Electronically Signed   By: Macy Mis M.D.   On: 11/20/2020 06:03   DG Chest 2 View  Result Date: 11/20/2020 CLINICAL DATA:  Altered mental status EXAM: CHEST - 2 VIEW COMPARISON:  03/14/2013 FINDINGS: Normal heart size and aortic contours. Probable small hiatal hernia. No acute infiltrate or edema. No effusion or pneumothorax. T12 compression fracture with moderate anterior height loss, new since 2015, age-indeterminate. IMPRESSION: No active cardiopulmonary disease. T12 compression fracture since 2015. Electronically Signed   By: Jorje Guild M.D.   On: 11/20/2020 04:08   DG Pelvis 1-2 Views  Result Date: 11/20/2020 CLINICAL DATA:  Fall yesterday landing on bottom EXAM: PELVIS - 1 VIEW; SACRUM AND COCCYX - 3 VIEW COMPARISON:  None. FINDINGS: No evidence of hip, pelvic ring, or sacral fracture. Left hip osteoarthritis with joint narrowing and spurring. There could be gluteal subcutaneous contusion on the lateral view. IMPRESSION: No acute fracture or diastasis. Electronically Signed   By: Jorje Guild M.D.   On: 11/20/2020 04:14   DG Sacrum/Coccyx  Result Date: 11/20/2020 CLINICAL DATA:  Fall yesterday landing on bottom EXAM: PELVIS - 1 VIEW; SACRUM AND COCCYX - 3 VIEW COMPARISON:  None. FINDINGS: No evidence of hip, pelvic ring, or sacral fracture. Left hip osteoarthritis with joint  narrowing and spurring. There could be gluteal subcutaneous contusion on the lateral view. IMPRESSION: No acute fracture or diastasis. Electronically Signed   By: Jorje Guild M.D.   On: 11/20/2020 04:14   CT HEAD WO CONTRAST  Result Date: 11/20/2020 CLINICAL DATA:  Fall EXAM: CT HEAD WITHOUT CONTRAST TECHNIQUE: Contiguous axial images were obtained from the base of the skull through the vertex without intravenous contrast. COMPARISON:  None. FINDINGS: Brain: There is subarachnoid hemorrhage within the interhemispheric fissure, basal cisterns and fourth ventricle. Generalized volume loss with changes of chronic microvascular ischemia. Compared to 11/05/2020, the lateral and third ventricles are dilated, indicating early communicating hydrocephalus. Vascular: No abnormal hyperdensity of the major intracranial arteries or dural venous sinuses. No intracranial atherosclerosis. Skull: The visualized skull base, calvarium and extracranial soft tissues are normal. Sinuses/Orbits: No fluid levels or advanced mucosal thickening of the visualized paranasal sinuses. No mastoid or middle ear effusion. The orbits are normal. IMPRESSION: 1. Subarachnoid hemorrhage within the interhemispheric fissure, basal cisterns and fourth ventricle. Given the history of recent fall and the absence of aneurysm on CTA head of 01/11/20, this is favored to be traumatic. 2. Early communicating hydrocephalus. Critical Value/emergent results were called by telephone at the time of interpretation on 11/20/2020 at 3:51 am to provider [facility] , who verbally acknowledged these results. Electronically Signed   By: Ulyses Jarred M.D.   On: 11/20/2020 03:53    Review of Systems  Unable to perform ROS: Mental status change   Blood pressure (!) 155/92, pulse 92, temperature 98 F (36.7 C), temperature source Oral, resp. rate 11, height 5' (1.524 m), weight 64.4 kg, SpO2 97 %. Physical Exam Vitals and nursing note reviewed.   Constitutional:      Appearance: She is well-developed.  HENT:     Head: Normocephalic and atraumatic.  Mouth/Throat:     Mouth: Mucous membranes are moist.     Pharynx: Oropharynx is clear.  Eyes:     Pupils: Pupils are equal, round, and reactive to light.  Cardiovascular:     Rate and Rhythm: Normal rate and regular rhythm.  Pulmonary:     Effort: No respiratory distress.     Breath sounds: No stridor.  Abdominal:     General: There is no distension.  Musculoskeletal:        General: No swelling or tenderness. Normal range of motion.     Cervical back: Normal range of motion.  Skin:    General: Skin is warm and dry.  Neurological:  Patient is awake, alert, oriented to self and time, disoriented to place and situation. She is conversant. She is in NAD and VSS. Speech is fluent and appropriate. MAEW with good strength. No drift. Sensation to light touch is intact. PERLA, EOMI. CNs grossly intact.     Assessment/Plan 75 y.o. female with altered mental status was BIB EMS and found to have a SAH. Her imaging revealed a subarachnoid hemorrhage within the interhemispheric fissure, basal cisterns and fourth ventricle with early communicating hydrocephalus. CTA head was negative for aneurysm or other new abnormality. On examination she has some disorientation and confusion, but otherwise appears to be at her baseline. Will admit to neuro ICU for close neurological monitoring. Patient will be discussed with Dr. Kathyrn Sheriff to see if a cerebral catheter angio would be beneficial. Plan to repeat CT head this evening.    Melissa Moeller, NP 11/20/2020, 8:58 AM   Addendum:  Patient seen and examined.  Agree with above.  Subarachnoid hemorrhage, angio negative thus far -Concern for aneurysmal etiology, will treat with Nimotop and subarachnoid hemorrhage pathway   Discussed with Dr. Kathyrn Sheriff, plan cerebral angiogram next week for further imaging.  Continue supportive care and ICU  care until then.

## 2020-11-20 NOTE — Progress Notes (Signed)
  Echocardiogram 2D Echocardiogram has been performed.  Melissa Wilkinson 11/20/2020, 2:55 PM

## 2020-11-20 NOTE — ED Notes (Signed)
Report called to [facility].  Pt resting in bed at present.

## 2020-11-21 ENCOUNTER — Inpatient Hospital Stay (HOSPITAL_COMMUNITY): Payer: Medicare Other

## 2020-11-21 DIAGNOSIS — I609 Nontraumatic subarachnoid hemorrhage, unspecified: Secondary | ICD-10-CM

## 2020-11-21 MED ORDER — LABETALOL HCL 5 MG/ML IV SOLN
10.0000 mg | INTRAVENOUS | Status: DC | PRN
  Administered 2020-11-21: 20 mg via INTRAVENOUS
  Administered 2020-11-21: 10 mg via INTRAVENOUS
  Administered 2020-11-21: 20 mg via INTRAVENOUS
  Filled 2020-11-21 (×3): qty 4

## 2020-11-21 NOTE — Progress Notes (Signed)
Patient with moderate ongoing confusion.  Mild headache.  No new neurologic symptoms.  Patient desires discharge home.  She is afebrile.  Her vital signs are stable.  She is awake and alert.  She is mildly confused.  She seems unrealistic with regard to her situation.  Speech is fluent.  Cranial nerve function intact bilateral.  Motor 5/5 bilaterally.  No pronator drift.  Follow-up head CT scan stable yesterday with basilar subarachnoid hemorrhage and some fourth ventricular blood.  Plan for catheter angiogram early next week.  Continue ICU observation.

## 2020-11-21 NOTE — Progress Notes (Signed)
Transcranial Doppler  Date POD PCO2 HCT BP  MCA ACA PCA OPHT SIPH VERT Basilar  10/1 JH     Right  Left   *  *   *  *   *  *   12  11   *  23   -13  -10   -22           Right  Left                                            Right  Left                                             Right  Left                                             Right  Left                                            Right  Left                                            Right  Left                                        MCA = Middle Cerebral Artery      OPHT = Opthalmic Artery     BASILAR = Basilar Artery   ACA = Anterior Cerebral Artery     SIPH = Carotid Siphon PCA = Posterior Cerebral Artery   VERT = Verterbral Artery                   Normal MCA = 62+\-12 ACA = 50+\-12 PCA = 42+\-23  Unable to obtain [place] ratio   11/21/2020 4:42 PM Jasaun Carn RVT, RDMS

## 2020-11-21 NOTE — Progress Notes (Signed)
PT Evaluation  PTA, patient lives alone and reports independence, however per RN, neighbor has had concerns about functioning in the home. Patient with difficulty problem solving and slow processing throughout session. Disoriented to place and situation during PT session but oriented during OT session immediately after. Patient presents with impaired balance, weakness, impaired cognition, decreased safety awareness, and decreased activity tolerance. Patient requires min guard-minA for balance during ambulation due to unsteadiness. Patient will benefit from skilled PT services during acute stay to address listed deficits. Recommend HHPT following discharge to maximize functional independence and safety in the home. Highly recommend 24 hour supervision upon returning home due to impaired cognition and decreased safety awareness.   11/21/20 0931  PT Visit Information  Last PT Received On 11/21/20  Assistance Needed +1  History of Present Illness 75 y/o female presented to [facility] ED on 9/30 following fall with subsequent jaw pain. Confusion while in ED. 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 Transferred to [facility] ICU. PMH: anxiety, HTN, depression, GERD.  Precautions  Precautions Fall  Precaution Comments SBP <140  Restrictions  Weight Bearing Restrictions No  Home Living  Family/patient expects to be discharged to: Private residence  Living Arrangements Alone  Available Help at Discharge Family;Friend(s);Available PRN/intermittently  Type of Home House  Home Access Stairs to enter  Entrance Stairs-Number of Steps 1 (threshold)  Home Layout One level  Print production planner Handicapped height  Bathroom Accessibility Yes  Home Equipment [facility]  Prior Function  Level of Independence Independent  Comments reports 3-4 falls in last 3 months  Communication   Communication No difficulties  Pain Assessment  Pain Assessment Faces  Faces Pain Scale 2  Pain Location ribs  Pain Descriptors / Indicators Discomfort;Grimacing  Pain Intervention(s) Monitored during session  Cognition  Arousal/Alertness Awake/alert  Behavior During Therapy WFL for tasks assessed/performed  Overall Cognitive Status Impaired/Different from baseline  Area of Impairment Orientation;Attention;Memory;Safety/judgement;Awareness;Problem solving  Orientation Level Disoriented to;Place;Situation  Current Attention Level Sustained  Memory Decreased short-term memory  Safety/Judgement Decreased awareness of deficits  Awareness Emergent  Problem Solving Slow processing;Requires verbal cues  General Comments Increased time to process directions and problem solve. STM deficits and perseverating at times. Previously A&Ox4 with RN but on arrival A&Ox2  Upper Extremity Assessment  Upper Extremity Assessment Defer to OT evaluation  Lower Extremity Assessment  Lower Extremity Assessment Generalized weakness  Cervical / Trunk Assessment  Cervical / Trunk Assessment Normal  Bed Mobility  Overal bed mobility Needs Assistance  Bed Mobility Supine to Sit  Supine to sit Supervision;HOB elevated  General bed mobility comments increased time to sequence getting OOB. Required use of bed rail and HOB elevated but no physical assist required.  Transfers  Overall transfer level Needs assistance  Equipment used None  Transfers Sit to/from Stand  Sit to Stand Min guard  General transfer comment min guard for safety. Mild unsteadiness upon standing with patient reaching for bed rail to steady  Ambulation/Gait  Ambulation/Gait assistance Min assist;Min guard  Gait Distance (Feet) 200 Feet  Assistive device None  Gait Pattern/deviations Step-through pattern;Decreased stride length;Decreased weight shift to right;Decreased weight shift to left;Narrow base of support;Drifts right/left  General  Gait Details minA initially for balance in room with patient reaching for objects to hold on to. Once out of room, patient required min guard for safety due to drifting and unsteadiness. No overt LOB noted but unsteady.  Gait velocity decreased  Modified Rankin (Stroke Patients Only)  Pre-Morbid Rankin Score 1  Modified Rankin 4  Balance  Overall balance assessment Mild deficits observed, not formally tested  PT - End of Session  Equipment Utilized During Treatment Gait belt  Activity Tolerance Patient tolerated treatment well  Patient left Other (comment) (in bathroom, handoff to OT)  Nurse Communication Mobility status  PT Assessment  PT Recommendation/Assessment Patient needs continued PT services  PT Visit Diagnosis Unsteadiness on feet (R26.81);Muscle weakness (generalized) (M62.81);History of falling (Z91.81)  PT Problem List Decreased strength;Decreased activity tolerance;Decreased mobility;Decreased balance;Decreased cognition;Decreased safety awareness  PT Plan  PT Frequency (ACUTE ONLY) Min 4X/week  PT Treatment/Interventions (ACUTE ONLY) DME instruction;Gait training;Stair training;Functional mobility training;Therapeutic activities;Balance training;Therapeutic exercise;Patient/family education;Neuromuscular re-education  AM-PAC PT "6 Clicks" Mobility Outcome Measure (Version 2)  Help needed turning from your back to your side while in a flat bed without using bedrails? 3  Help needed moving from lying on your back to sitting on the side of a flat bed without using bedrails? 3  Help needed moving to and from a bed to a chair (including a wheelchair)? 3  Help needed standing up from a chair using your arms (e.g., wheelchair or bedside chair)? 3  Help needed to walk in hospital room? 3  Help needed climbing 3-5 steps with a railing?  3  6 Click Score 18  Consider Recommendation of Discharge To: Home with [facility]  Progressive Mobility  What is the highest level of mobility based on  the progressive mobility assessment? Level 5 (Walks with assist in room/hall) - Balance while stepping forward/back and can walk in room with assist - Complete  Mobility Ambulated with assistance in hallway  PT Recommendation  Follow Up Recommendations Home health PT;Supervision/Assistance - 24 hour  PT equipment None recommended by PT  Individuals Consulted  Consulted and Agree with Results and Recommendations Patient  Acute Rehab PT Goals  Patient Stated Goal to go home  PT Goal Formulation With patient  Time For Goal Achievement 12/05/20  Potential to Achieve Goals Good  PT Time Calculation  PT Start Time (ACUTE ONLY) 2902  PT Stop Time (ACUTE ONLY) 0926  PT Time Calculation (min) (ACUTE ONLY) 34 min  PT General Charges  $$ ACUTE PT VISIT 1 Visit  PT Evaluation  $PT Eval Moderate Complexity 1 Mod  PT Treatments  $Therapeutic Activity 8-22 mins  Written Expression  Dominant Hand Right    Maxwell Lemen A. Gilford Rile, PT, DPT Acute Rehabilitation Services Pager [phone] Office [phone]

## 2020-11-21 NOTE — Evaluation (Signed)
Occupational Therapy Evaluation Patient Details Name: Melissa Wilkinson MRN: [id number] DOB: [birth year] Today's Date: 11/21/2020   History of Present Illness 75 y/o female presented to [place] ED on 9/30 following fall with subsequent jaw pain. Confusion while in ED. 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 Transferred to [facility] ICU. PMH: anxiety, HTN, depression, GERD.  Pt also with recent frontotemporal epidural and SDH in 8/22 after sustaining a fall.   Clinical Impression   Pt admitted with above. She demonstrates the below listed deficits and will benefit from continued OT to maximize safety and independence with BADLs.  Pt presents to OT with impaired cognition, scored well on the Short Blessed Test 2/28, but during functional tasks she demonstrates slow processing, impaired problem solving, impaired attention, impaired orientation, and impaired safety awareness as well as impairments with memory.  She is able to complete ADLs with close min guard assist, but requires cues for thoroughness, attention, and problem solving. She reports she lives alone as her spouse recently expired and [place].  She reports she was fully independent PTA, and that her neighbors and friends can assist at discharge.  RN contacted neighbor, who reports that by the appearance of pt's home, she has been struggling at home.  Feel pt needs 24 hour assist/supervision at discharge with likely transition to ALF.   Recommend HHOT, PT, SW .  Will follow acutely.        Recommendations for follow up therapy are one component of a multi-disciplinary discharge planning process, led by the attending physician.  Recommendations may be updated based on patient status, additional functional criteria and insurance authorization.   Follow Up Recommendations  Home health OT;Supervision/Assistance - 24 hour  (ultimately, pt likely needs ALF)    Equipment Recommendations  Tub/shower seat    Recommendations for Other Services       Precautions / Restrictions Precautions Precautions: Fall Precaution Comments: SBP <140 Restrictions Weight Bearing Restrictions: No      Mobility Bed Mobility Overal bed mobility: Needs Assistance Bed Mobility: Supine to Sit     Supine to sit: Supervision;HOB elevated     General bed mobility comments: increased time to sequence getting OOB. Required use of bed rail and HOB elevated but no physical assist required.    Transfers Overall transfer level: Needs assistance Equipment used: None Transfers: Sit to/from Stand Sit to Stand: Min guard         General transfer comment: min guard for safety. Mild unsteadiness upon standing with patient reaching for bed rail to steady    Balance Overall balance assessment: Mild deficits observed, not formally tested                                         ADL either performed or assessed with clinical judgement   ADL Overall ADL's : Needs assistance/impaired Eating/Feeding: Independent;Sitting   Grooming: Wash/dry hands;Wash/dry face;Oral care;Brushing hair;Minimal assistance;Standing Grooming Details (indicate cue type and reason): assist for thoroughness Upper Body Bathing: Set up;Sitting   Lower Body Bathing: Min guard;Sit to/from stand   Upper Body Dressing : Set up;Sitting   Lower Body Dressing: Min guard;Sit to/from stand   Toilet Transfer: Min guard;Ambulation;Comfort height toilet   Toileting- Clothing Manipulation and Hygiene: Min guard;Sit to/from stand       Functional mobility  during ADLs: Min guard General ADL Comments: min guard for safety     Vision Baseline Vision/History: 1 Wears glasses Patient Visual Report: No change from baseline Vision Assessment?: Yes Eye Alignment: Within Functional Limits Ocular Range of Motion: Within Functional  Limits Alignment/Gaze Preference: Within Defined Limits Tracking/Visual Pursuits: Able to track stimulus in all quads without difficulty Convergence: Within functional limits Visual Fields: No apparent deficits     Perception     Praxis      Pertinent Vitals/Pain Pain Assessment: Faces Faces Pain Scale: Hurts a little bit Pain Location: ribs Pain Descriptors / Indicators: Discomfort;Grimacing Pain Intervention(s): Monitored during session     Hand Dominance Right   Extremity/Trunk Assessment Upper Extremity Assessment Upper Extremity Assessment: Generalized weakness   Lower Extremity Assessment Lower Extremity Assessment: Overall WFL for tasks assessed   Cervical / Trunk Assessment Cervical / Trunk Assessment: Normal   Communication Communication Communication: No difficulties   Cognition Arousal/Alertness: Awake/alert Behavior During Therapy: WFL for tasks assessed/performed Overall Cognitive Status: Impaired/Different from baseline Area of Impairment: Orientation;Attention;Memory;Safety/judgement;Awareness;Problem solving                 Orientation Level: Disoriented to;Place;Situation Current Attention Level: Sustained Memory: Decreased short-term memory   Safety/Judgement: Decreased awareness of deficits Awareness: Emergent Problem Solving: Slow processing;Requires verbal cues General Comments: Pt scored 2/28 on the Short Blessed Test which is [facility] for this test, however, during functional tasks she requires increased time to process directions and problem solve. STM deficits and perseverating at times. Pt unable to tell me why she is in hospital.   General Comments  Pt reports she has not been coping well since her spouse expired    Exercises     Shoulder Instructions      Home Living Family/patient expects to be discharged to:: Private residence Living Arrangements: Alone Available Help at Discharge: Family;Friend(s);Available  PRN/intermittently Type of Home: House Home Access: Stairs to enter [employer] of Steps: 1 (threshold)   Home Layout: One level     Bathroom Shower/Tub: Occupational psychologist: Handicapped height Bathroom Accessibility: Yes   Home Equipment: Bedside commode   Additional Comments: Pt reports she lives alone with neighbors checking on her periodically.  He spouse died in 22-Sep-2020.  She has a son who lives in the [place], and a teenage grand daughter who lives in [place] (pt had another son who died several years ago).  Pt reports no other family members.  She has two dogs  Lives With: Alone    Prior Functioning/Environment Level of Independence: Independent        Comments: reports 3-4 falls in last 3 months.  The first one she reports she was assisting her spouse, who had a brain tumor, and he fall into her resulting in her striking her head on the mantle        OT Problem List: Decreased activity tolerance;Impaired balance (sitting and/or standing);Decreased cognition;Decreased safety awareness      OT Treatment/Interventions: Self-care/ADL training;DME and/or AE instruction;Therapeutic activities;Cognitive remediation/compensation;Patient/family education;Balance training    OT Goals(Current goals can be found in the care plan section) Acute Rehab OT Goals Patient Stated Goal: to go home OT Goal Formulation: With patient Time For Goal Achievement: 12/05/20 Potential to Achieve Goals: Good  OT Frequency: Min 2X/week   Barriers to D/C: Decreased caregiver support          Co-evaluation              AM-PAC OT "  6 Clicks" Daily Activity     Outcome Measure Help from another person eating meals?: None Help from another person taking care of personal grooming?: A Little Help from another person toileting, which includes using toliet, bedpan, or urinal?: A Little Help from another person bathing (including washing, rinsing, drying)?: A  Little Help from another person to put on and taking off regular upper body clothing?: A Little Help from another person to put on and taking off regular lower body clothing?: A Little 6 Click Score: 19   End of Session Nurse Communication: Mobility status  Activity Tolerance: Patient tolerated treatment well Patient left: in chair;with call bell/phone within reach;with chair alarm set  OT Visit Diagnosis: Unsteadiness on feet (R26.81);Cognitive communication deficit (R41.841)                Time: 4403-4742 OT Time Calculation (min): 24 min Charges:  OT General Charges $OT Visit: 1 Visit OT Evaluation $OT Eval Moderate Complexity: 1 Mod OT Treatments $Self Care/Home Management : 8-22 mins  Nilsa Nutting., OTR/L Acute Rehabilitation Services Pager [phone] Office [place], [place] 11/21/2020, 12:21 PM

## 2020-11-22 MED ORDER — LABETALOL HCL 5 MG/ML IV SOLN: 10.0000 mg | INTRAVENOUS | Status: DC | PRN

## 2020-11-22 NOTE — Progress Notes (Signed)
Overall stable.  Some mild headache and continued confusion.  No new complaints otherwise.  Patient wants to go home.  She is afebrile.  Her vital signs are stable.  She awakens easily.  She remains pleasantly confused.  Speech is fluent.  Motor 5/5 bilaterally.  No drift.  Chest and abdomen benign.  Transcranial Dopplers without evidence of spasm.  Progressing well.  Plan for catheter angiogram later this week to evaluate for cause of subarachnoid hemorrhage.

## 2020-11-22 NOTE — Progress Notes (Signed)
Occupational Therapy Treatment Patient Details Name: Melissa Wilkinson MRN: [id number] DOB: 1945-05-11 Today's Date: 11/22/2020   History of present illness 75 y/o female presented to [place] ED on 9/30 following fall with subsequent jaw pain. Confusion while in ED. 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 Transferred to [facility] ICU. PMH: anxiety, HTN, depression, GERD.  Pt also with recent frontotemporal epidural and SDH in 8/22 after sustaining a fall.   OT comments  Pt a more unsteady this date when ambulating - required min A with occasional LOB requiring physical assist to correct.   She required min cues for a path finding task, but required mod A to find her way back to her room.  She was unable to perform simple math/money management.  She requires min A for ADLs due to balance deficits.  Continue to recommend 24 hour assist.   Recommendations for follow up therapy are one component of a multi-disciplinary discharge planning process, led by the attending physician.  Recommendations may be updated based on patient status, additional functional criteria and insurance authorization.    Follow Up Recommendations  Home health OT;Supervision/Assistance - 24 hour (Pt with plans to live with her friend.  She likely will need long term supervision/assist at discharge.)    Equipment Recommendations  Tub/shower seat    Recommendations for Other Services      Precautions / Restrictions Precautions Precautions: Fall Precaution Comments: SBP <140       Mobility Bed Mobility Overal bed mobility: Needs Assistance Bed Mobility: Sit to Supine     Supine to sit: Supervision;HOB elevated          Transfers Overall transfer level: Needs assistance Equipment used: None Transfers: Sit to/from [facility] Sit to Stand: Min assist Stand pivot transfers: Min assist        General transfer comment: Min A to steady.  Pt unsteady at times with occasional LOB    Balance Overall balance assessment: Mild deficits observed, not formally tested                                         ADL either performed or assessed with clinical judgement   ADL Overall ADL's : Needs assistance/impaired Eating/Feeding: Independent;Sitting   Grooming: Wash/dry hands;Wash/dry face;Oral care;Brushing hair;Standing;Min guard Grooming Details (indicate cue type and reason): cues for thoroughness Upper Body Bathing: Set up;Sitting   Lower Body Bathing: Min guard;Sit to/from stand   Upper Body Dressing : Set up;Sitting   Lower Body Dressing: Min guard;Sit to/from stand   Toilet Transfer: Min guard;Ambulation;Comfort height toilet   Toileting- Clothing Manipulation and Hygiene: Sit to/from stand;Minimal assistance Toileting - Clothing Manipulation Details (indicate cue type and reason): assist to steady     Functional mobility during ADLs: Minimal assistance General ADL Comments: min A to steady     Vision   Additional Comments: Pt required min cues for visual scanning   Perception     Praxis      Cognition Arousal/Alertness: Awake/alert Behavior During Therapy: WFL for tasks assessed/performed Overall Cognitive Status: Impaired/Different from baseline Area of Impairment: Orientation;Attention;Memory;Safety/judgement;Awareness;Problem solving;Following commands                 Orientation Level: Disoriented to;Place Current Attention Level: Selective (with up to mod cues) Memory: Decreased short-term memory Following Commands: Follows one step commands  consistently;Follows multi-step commands inconsistently Safety/Judgement: Decreased awareness of deficits Awareness: Emergent Problem Solving: Slow processing;Requires verbal cues General Comments: Pt given a 3 step command and was able to execute it with min cues during a path finding  task.  She required mod cues to find her way back to her room, and wasn't able to recognize her errors even when cued.  Pt was highly distractable requiring mod cues to redirect to the task.  She was unable to perform simple math $2.25 - 1.50 or $2.50-1.50 despite multiple attempts.        Exercises     Shoulder Instructions       General Comments Pt reports she plans to live with her friend Melissa Wilkinson, who can provide 24 hour assist and another friend will also be available as needed    Pertinent Vitals/ Pain       Pain Assessment: Faces Faces Pain Scale: Hurts a little bit Pain Location: Rt temple Pain Descriptors / Indicators: Discomfort;Grimacing  Home Living                                          Prior Functioning/Environment              Frequency  Min 2X/week        Progress Toward Goals  OT Goals(current goals can now be found in the care plan section)  Progress towards OT goals: Progressing toward goals  Acute Rehab OT Goals Patient Stated Goal: to go home OT Goal Formulation: With patient Time For Goal Achievement: 12/05/20 Potential to Achieve Goals: Good  Plan Discharge plan remains appropriate    Co-evaluation                 AM-PAC OT "6 Clicks" Daily Activity     Outcome Measure   Help from another person eating meals?: None Help from another person taking care of personal grooming?: A Little Help from another person toileting, which includes using toliet, bedpan, or urinal?: A Little Help from another person bathing (including washing, rinsing, drying)?: A Little Help from another person to put on and taking off regular upper body clothing?: A Little Help from another person to put on and taking off regular lower body clothing?: A Little 6 Click Score: 19    End of Session Equipment Utilized During Treatment: Gait belt  OT Visit Diagnosis: Unsteadiness on feet (R26.81);Cognitive communication deficit (R41.841)    Activity Tolerance Patient tolerated treatment well   Patient Left with call bell/phone within reach;in bed;with bed alarm set   Nurse Communication Mobility status        Time: 0867-6195 OT Time Calculation (min): 33 min  Charges: OT General Charges $OT Visit: 1 Visit OT Treatments $Therapeutic Activity: 23-37 mins  Nilsa Nutting., OTR/L Acute Rehabilitation Services Pager [phone] Office [phone]   Lucille Passy M 11/22/2020, 4:07 PM

## 2020-11-23 ENCOUNTER — Inpatient Hospital Stay (HOSPITAL_COMMUNITY): Payer: Medicare Other

## 2020-11-23 DIAGNOSIS — I609 Nontraumatic subarachnoid hemorrhage, unspecified: Secondary | ICD-10-CM

## 2020-11-23 NOTE — Progress Notes (Signed)
Subjective: Patient reports that she would like to go home. She stated that she is beginning to remember mo details about her fall last week and reports that she hit the right side of her head. No acute events overnight.   Objective: Vital signs in last 24 hours: Temp:  [98.2 F (36.8 C)-98.6 F (37 C)] 98.6 F (37 C) (10/03 0455) Pulse Rate:  [66-84] 73 (10/03 0800) Resp:  [11-23] 22 (10/03 0800) BP: (136-160)/(66-97) 148/79 (10/03 0700) SpO2:  [89 %-98 %] 95 % (10/03 0800)  Intake/Output from previous day: 10/02 0701 - 10/03 0700 In: 480 [P.O.:480] Out: -  Intake/Output this shift: No intake/output data recorded.  Physical Exam: Patient is awake, A/O X 4, conversant, and in good spirits. They are in NAD and VSS. Doing well. Speech is fluent and appropriate. MAEW with good strength that is symmetric bilaterally. 5/5 BUE/BLE. Sensation to light touch is intact. PERLA, EOMI. CNs grossly intact.      Lab Results: No results for input(s): WBC, HGB, HCT, PLT in the last 72 hours. BMET No results for input(s): NA, K, CL, CO2, GLUCOSE, BUN, CREATININE, CALCIUM in the last 72 hours.  Studies/Results: VAS [place] TRANSCRANIAL DOPPLER  Result Date: 11/23/2020  Transcranial Doppler Patient Name:  Melissa Wilkinson  Date of Exam:   11/21/2020 Medical Rec #: [id number]           Accession #:    [id number] Date of Birth: 12-07-1945           Patient Gender: F Patient Age:   75 years Exam Location:  [facility] Procedure:      VAS [place] TRANSCRANIAL DOPPLER Referring Phys: TROY DAWLEY --------------------------------------------------------------------------------  Indications: Subarachnoid hemorrhage. Limitations for diagnostic windows: Unable to insonate right transtemporal window. Unable to insonate left transtemporal window. Comparison Study: No previous exams Performing Technologist: Jody Hill RVT, RDMS  Examination Guidelines: A complete evaluation includes B-mode imaging, spectral Doppler,  color Doppler, and power Doppler as needed of all accessible portions of each vessel. Bilateral testing is considered an integral part of a complete examination. Limited examinations for reoccurring indications may be performed as noted.  +----------+-------------+----------+-----------+------------------+ RIGHT TCD Right VM (cm)Depth (cm)Pulsatility     Comment       +----------+-------------+----------+-----------+------------------+ MCA                                         unable to insonate +----------+-------------+----------+-----------+------------------+ ACA                                         unable to insonate +----------+-------------+----------+-----------+------------------+ Term ICA                                    unable to insonate +----------+-------------+----------+-----------+------------------+ PCA                                         unable to insonate +----------+-------------+----------+-----------+------------------+ Opthalmic     12.00                 1.02                       +----------+-------------+----------+-----------+------------------+  ICA siphon                                  unable to insonate +----------+-------------+----------+-----------+------------------+ Vertebral    -13.00                 1.10                       +----------+-------------+----------+-----------+------------------+ Distal ICA    18.00                 0.78                       +----------+-------------+----------+-----------+------------------+  +----------+------------+----------+-----------+------------------+ LEFT TCD  Left VM (cm)Depth (cm)Pulsatility     Comment       +----------+------------+----------+-----------+------------------+ MCA                                        unable to insonate +----------+------------+----------+-----------+------------------+ ACA                                        unable to  insonate +----------+------------+----------+-----------+------------------+ Term ICA                                   unable to insonate +----------+------------+----------+-----------+------------------+ PCA                                        unable to insonate +----------+------------+----------+-----------+------------------+ Opthalmic    11.00                 1.04                       +----------+------------+----------+-----------+------------------+ ICA siphon   23.00                 0.77                       +----------+------------+----------+-----------+------------------+ Vertebral    -10.00                1.17                       +----------+------------+----------+-----------+------------------+ Distal ICA   22.00                 1.34                       +----------+------------+----------+-----------+------------------+  +------------+------+-------+             VM cm Comment +------------+------+-------+ Prox Basilar-22.00 1.11   +------------+------+-------+ Summary:  Highly suboptimal study due to absent bitemporal and ppor suboccipital windows. Antegrade flow in both opthalmics and distal internal carotid artries noted. *See table(s) above for TCD measurements and observations.  Diagnosing physician: Antony Contras MD Electronically signed by Antony Contras MD on 11/23/2020 at 8:26:00 AM.    Final     Assessment/Plan: 75 y.o. female with AMS was found to have a [place]. Her imaging revealed a  subarachnoid hemorrhage within the interhemispheric fissure, basal cisterns and fourth ventricle CTA head was negative for aneurysm or other new abnormality. On examination she has some disorientation and confusion, but otherwise appears to be at her baseline. Due to concern over possible aneurysmal etiology, continue to treat with Nimotop and subarachnoid hemorrhage pathway. Plan for cerebral angiogram this week.  Continue supportive care and ICU care.   LOS:  3 days     Melissa Moeller, DNP, NP-C 11/23/2020, 8:40 AM

## 2020-11-23 NOTE — Progress Notes (Signed)
Transcranial Doppler   Date POD PCO2 HCT BP   MCA ACA PCA OPHT SIPH VERT Basilar  10/1 JH         Right  Left   *  *   *  *   *  *   12  11   *  23   -13  -10   -22       10/3          Right  Left                                                               Right  Left                                                                 Right  Left                                                                 Right  Left                                                               Right  Left                                                               Right  Left                                                       MCA = Middle Cerebral Artery      OPHT = Opthalmic Artery     BASILAR = Basilar Artery   ACA = Anterior Cerebral Artery     SIPH = Carotid Siphon PCA = Posterior Cerebral Artery   VERT = Verterbral Artery                    Normal MCA = 62+\-12 ACA = 50+\-12 PCA = 42+\-23  Unable to obtain [place] ratio

## 2020-11-23 NOTE — TOC Initial Note (Signed)
Transition of Care [facility]) - Initial/Assessment Note    Patient Details  Name: Melissa Wilkinson MRN: [id number] Date of Birth: Apr 29, 1945  Transition of [facility]) CM/SW Contact:    Ella Bodo, RN Phone Number: 11/23/2020, 3:37 PM  Clinical Narrative:                 75 y/o female presented to [place] ED on 9/30 following fall with subsequent jaw pain. Confusion while in ED. 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  Prior to admission, patient independent living at home alone; she states that her husband died approximately 3 weeks ago.  She plans to discharge to a friend's home at discharge, who can provide 24-hour supervision.  PT/OT recommending home health follow-up, but patient politely declines home health follow-up.  MD at bedside, and was made aware.  Expected Discharge Plan: Home/Self Care Barriers to Discharge: Continued Medical Work up   Patient Goals and CMS Choice Patient states their goals for this hospitalization and ongoing recovery are:: to go home      Expected Discharge Plan and Services Expected Discharge Plan: Home/Self Care   Discharge Planning Services: CM Consult   Living arrangements for the past 2 months: Single Family Home                           HH Arranged: Refused HH          Prior Living Arrangements/Services Living arrangements for the past 2 months: Single Family Home Lives with:: Self Patient language and need for interpreter reviewed:: Yes Do you feel safe going back to the place where you live?: Yes      Need for Family Participation in Patient Care: Yes (Comment) Care giver support system in place?: Yes (comment)   Criminal Activity/Legal Involvement Pertinent to Current Situation/Hospitalization: No - Comment as needed  Activities of Daily Living Home Assistive Devices/Equipment: Built-in shower seat ADL Screening  (condition at time of admission) Patient's cognitive ability adequate to safely complete daily activities?: Yes Is the patient deaf or have difficulty hearing?: No Does the patient have difficulty seeing, even when wearing glasses/contacts?: No Does the patient have difficulty concentrating, remembering, or making decisions?: No Patient able to express need for assistance with ADLs?: Yes Does the patient have difficulty dressing or bathing?: No Independently performs ADLs?: Yes (appropriate for developmental age)                   Emotional Assessment Appearance:: Appears stated age Attitude/Demeanor/Rapport: Engaged Affect (typically observed): Accepting Orientation: : Oriented to Self, Oriented to Place, Oriented to  Time, Oriented to Situation      Admission diagnosis:  SAH (subarachnoid hemorrhage) ([place]) [I60.9] Subarachnoid hemorrhage ([place]) [I60.9] Patient Active Problem List   Diagnosis Date Noted   SAH (subarachnoid hemorrhage) ([place]) 11/20/2020   Subarachnoid hemorrhage ([place]) 11/20/2020   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  Reinaldo Raddle, RN, BSN  Trauma/Neuro ICU Case Manager [phone]

## 2020-11-23 NOTE — Progress Notes (Signed)
Physical Therapy Treatment Patient Details Name: Melissa Wilkinson MRN: [id number] DOB: 29-Aug-1945 Today's Date: 11/23/2020   History of Present Illness 75 y/o female presented to [facility] ED on 9/30 following fall with subsequent jaw pain. Confusion while in ED. 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 Transferred to [facility] ICU. PMH: anxiety, HTN, depression, GERD.  Pt also with recent frontotemporal epidural and SDH in 8/22 after sustaining a fall.    PT Comments    Pt eager to d/c home, agreeable to work with therapies. Pt still lacking safety awareness and demonstrates poor balance especially when attention is divided, pt does not notice LOB and must be pointed out by PT. PT strongly recommending 24/7 assist and told pt that someone needs to be with her when she is mobilizing especially initially at d/c, pt states the person she is staying with can provide 24/7. PT to continue to follow.     Recommendations for follow up therapy are one component of a multi-disciplinary discharge planning process, led by the attending physician.  Recommendations may be updated based on patient status, additional functional criteria and insurance authorization.  Follow Up Recommendations  Home health PT;Supervision/Assistance - 24 hour     Equipment Recommendations  None recommended by PT    Recommendations for Other Services       Precautions / Restrictions Precautions Precautions: Fall Precaution Comments: SBP <140 Restrictions Weight Bearing Restrictions: No     Mobility  Bed Mobility Overal bed mobility: Needs Assistance Bed Mobility: Supine to Sit;Sit to Supine     Supine to sit: Supervision Sit to supine: Min assist;HOB elevated   General bed mobility comments: Supervision for safety, pt attempting to get to EOB with bedrail still up requiring cues to wait. light assist to lift LEs back  into bed, boost up.    Transfers Overall transfer level: Needs assistance Equipment used: None Transfers: Sit to/from Stand Sit to Stand: Min guard         General transfer comment: for safety, pt reaching for environement to self-steady.  Ambulation/Gait Ambulation/Gait assistance: Min guard;Min assist Gait Distance (Feet): 300 Feet Assistive device: None Gait Pattern/deviations: Step-through pattern;Decreased stride length;Narrow base of support;Drifts right/left Gait velocity: decr   General Gait Details: min assist to steady, especially when distracted or looking around in hallway. Pt with little to no awareness of imbalance, when PT points it out pt gives excuse such as "I was just challenging my balance"   Stairs             Wheelchair Mobility    Modified Rankin (Stroke Patients Only) Modified Rankin (Stroke Patients Only) Pre-Morbid Rankin Score: No significant disability Modified Rankin: Moderately severe disability     Balance Overall balance assessment: Needs assistance Sitting-balance support: No upper extremity supported;Feet supported Sitting balance-Leahy Scale: Good     Standing balance support: No upper extremity supported;During functional activity Standing balance-Leahy Scale: Fair               High level balance activites: Direction changes;Turns;Head turns;Other (comment) High Level Balance Comments: mild impairment (mild unsteadiness but recovers): fast/slow gait; moderate impairment (weaving of gait, LOB): horizontal and vertical head turns, turn and stop            Cognition Arousal/Alertness: Awake/alert Behavior During Therapy: WFL for tasks assessed/performed Overall Cognitive Status: Impaired/Different from baseline Area of Impairment: Attention;Memory;Safety/judgement;Awareness;Problem solving;Following commands  Current Attention Level: Sustained Memory: Decreased short-term memory Following  Commands: Follows one step commands consistently;Follows multi-step commands inconsistently Safety/Judgement: Decreased awareness of deficits Awareness: Emergent Problem Solving: Slow processing;Requires verbal cues General Comments: Does not consistently follow 2+ step commands, and does not have awareness that she does not complete task asked of her. Cannot find way back to room without mod PT cuing, loses balance when talking or looking around hallway but states she was "just testing her balance"      Exercises      General Comments        Pertinent Vitals/Pain Pain Assessment: No/denies pain Faces Pain Scale: No hurt Pain Intervention(s): Monitored during session    Home Living                      Prior Function            PT Goals (current goals can now be found in the care plan section) Acute Rehab PT Goals Patient Stated Goal: to go home PT Goal Formulation: With patient Time For Goal Achievement: 12/05/20 Potential to Achieve Goals: Good Progress towards PT goals: Progressing toward goals    Frequency    Min 4X/week      PT Plan Current plan remains appropriate    Co-evaluation              AM-PAC PT "6 Clicks" Mobility   Outcome Measure  Help needed turning from your back to your side while in a flat bed without using bedrails?: A Little Help needed moving from lying on your back to sitting on the side of a flat bed without using bedrails?: A Little Help needed moving to and from a bed to a chair (including a wheelchair)?: A Little Help needed standing up from a chair using your arms (e.g., wheelchair or bedside chair)?: A Little Help needed to walk in hospital room?: A Little Help needed climbing 3-5 steps with a railing? : A Little 6 Click Score: 18    End of Session Equipment Utilized During Treatment: Gait belt Activity Tolerance: Patient tolerated treatment well Patient left: in bed;with call bell/phone within reach;with bed  alarm set;with nursing/sitter in room Nurse Communication: Mobility status PT Visit Diagnosis: Unsteadiness on feet (R26.81);Muscle weakness (generalized) (M62.81);History of falling (Z91.81)     Time: 3664-4034 PT Time Calculation (min) (ACUTE ONLY): 22 min  Charges:  $Gait Training: 8-22 mins                     Stacie Glaze, PT DPT Acute Rehabilitation Services Pager [phone]  Office [phone]   [place] 11/23/2020, 12:51 PM

## 2020-11-24 LAB — CBC
HCT: 42.8 % (ref 36.0–46.0)
Hemoglobin: 14.1 g/dL (ref 12.0–15.0)
MCH: 31.3 pg (ref 26.0–34.0)
MCHC: 32.9 g/dL (ref 30.0–36.0)
MCV: 94.9 fL (ref 80.0–100.0)
Platelets: 314 10*3/uL (ref 150–400)
RBC: 4.51 MIL/uL (ref 3.87–5.11)
RDW: 13 % (ref 11.5–15.5)
WBC: 11 10*3/uL — ABNORMAL HIGH (ref 4.0–10.5)
nRBC: 0 % (ref 0.0–0.2)

## 2020-11-24 LAB — BASIC METABOLIC PANEL
Anion gap: 11 (ref 5–15)
BUN: 18 mg/dL (ref 8–23)
CO2: 25 mmol/L (ref 22–32)
Calcium: 9 mg/dL (ref 8.9–10.3)
Chloride: 99 mmol/L (ref 98–111)
Creatinine, Ser: 0.75 mg/dL (ref 0.44–1.00)
GFR, Estimated: >60 mL/min (ref >60)
Glucose, Bld: 123 mg/dL — ABNORMAL HIGH (ref 70–99)
Potassium: 3.7 mmol/L (ref 3.5–5.1)
Sodium: 135 mmol/L (ref 135–145)

## 2020-11-24 NOTE — Progress Notes (Signed)
Speech Language Pathology Treatment: Cognitive-Linquistic  Patient Details Name: Melissa Wilkinson MRN: [id number] DOB: 14-Nov-1945 Today's Date: 11/24/2020 Time: 1410-1431 SLP Time Calculation (min) (ACUTE ONLY): 21 min  Assessment / Plan / Recommendation Clinical Impression  Pt was seen for cognitive therapy targeting executive functioning (planning). Upon entry, SLP observed pt making plans with friend, Dorothyann Peng, regarding a phone transfer from pt's broken phone to a new phone. SLP noted higher level inattention to detail during conversation resulting in misunderstanding between pt and friend. When provided verbal explanation of details later from SLP, pt reported understanding and when asked to recall details of conversation ~68m later, pt able to recall general outcome. Pt reports having made plans at home for the care of her two dogs, her dishes, and her laundry. She also discussed her next plans after hospital discharge and the people who will be providing help and care at home. Pt exhibited anticipatory awareness of home responsibilities, describing her need to settle her husband's estate after returning home however continues to require assist in this area. SLP will continue to follow pt to initiate compensatory strategies for higher level inattention to help facilitate communicative ease during conversations re: organizing care and responsibilities.    HPI HPI: 75 year old female brought in by EMS for unclear reasons. CT revealed suffering small volume subarachnoid hemorrhage in the interhemispheric fissure, basilar cisterns, and fourth ventricle atient Per chart pt states that she fell and has some jaw pain. Per CT traumatic hemorrhage is suspected. PMH: GERD, HTN, anxiety. Husband passed away recently.      SLP Plan  Continue with current plan of care      Recommendations for follow up therapy are one component of a multi-disciplinary discharge planning process, led by the attending  physician.  Recommendations may be updated based on patient status, additional functional criteria and insurance authorization.    Recommendations                   Oral Care Recommendations: Oral care BID Follow up Recommendations: 24 hour supervision/assistance SLP Visit Diagnosis: Cognitive communication deficit (J03.009) Plan: Continue with current plan of care       GO              Dewitt Rota, SLP-Student   Dewitt Rota  11/24/2020, 3:30 PM

## 2020-11-24 NOTE — TOC Progression Note (Signed)
Transition of Care [facility]) - Progression Note    Patient Details  Name: Melissa Wilkinson MRN: [id number] Date of Birth: 08/15/1945  Transition of Care [facility]) CM/SW Contact  Oren Section Cleta Alberts, RN Phone Number: 11/24/2020, 5:23 PM  Clinical Narrative:    Patient still requiring home health follow-up; met with patient again to see if she will agree to home health services.  Patient's friend Anderson Malta at bedside; encouraged patient to accept [facility] services.  Patient planning to discharge home with her friend Daun Peacock, phone number [phone].  Discharge address is [address], [place], [place] [address].  Referral to [place] for continued therapies at home, per patient choice.  Patient states she has cane and rolling walker at home.  MD: Please enter orders for home health physical, occupational, and speech therapies with face-to-face documentation for Medicare prior to discharge.   Expected Discharge Plan: [place] Barriers to Discharge: Continued Medical Work up  Expected Discharge Plan and Services Expected Discharge Plan: [place]   Discharge Planning Services: CM Consult Post Acute Care Choice: [place] arrangements for the past 2 months: Single Family Home                           HH Arranged: PT, OT, Speech Therapy HH Agency: [place] ([facility]) Date HH Agency Contacted: 11/24/20 Time [place]: 1603 Representative spoke with at [place]: [place] ([place]) Interventions    Readmission Risk Interventions No flowsheet data found.  Reinaldo Raddle, RN, BSN  Trauma/Neuro ICU Case Manager [phone]

## 2020-11-24 NOTE — Progress Notes (Signed)
  NEUROSURGERY PROGRESS NOTE   Pt hx reviewed with attending MD, in EMR, and with pt. Presented after a fall with CT demonstrating relatively diffuse basal SAH concerning for aneurysmal hemorrhage. Initial CTA negative. Pt has been monitored in ICU, remains neurologically well.   Pt with hx of HTN, non-smoker, no known FHx of aneurysms.  EXAM:  BP 121/79   Pulse 81   Temp 97.9 F (36.6 C) (Oral)   Resp 18   Ht 5' (1.524 m)   Wt 64.4 kg   SpO2 98%   BMI 27.73 kg/m   Awake, alert, oriented  Speech fluent, appropriate  CN grossly intact  5/5 BUE/BLE   IMPRESSION:  75 y.o. female s/p SAH, neurologically well with initial CTA negative.  PLAN: - Will plan on confirmatory diagnostic angiogram tomorrow pm. - Cont current mgmt  I have reviewed the situation with the patient including my recommendation for the angiogram as a confirmatory test. We discussed the risks of the procedure as well as the expected post-procedure course. All her questions today were answered and she provided verbal and written consent to proceed, witnessed by her RN.   Consuella Lose, MD [facility]

## 2020-11-24 NOTE — Progress Notes (Signed)
Physical Therapy Treatment Patient Details Name: Melissa Wilkinson MRN: [id number] DOB: 12/09/1945 Today's Date: 11/24/2020   History of Present Illness 75 y/o female presented to [facility] ED on 9/30 following fall with subsequent jaw pain. Confusion while in ED. 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 Transferred to [facility] ICU. PMH: anxiety, HTN, depression, GERD.  Pt also with recent frontotemporal epidural and SDH in 8/22 after sustaining a fall.    PT Comments    Pt reports wanting to go home upon PT arrival to room, making moves to get to EOB stating "I saw the doctor and want to talk to him about going home". Pt remains unsteady during dynamic activity especially when distracted, tangential, and has poor safety awareness requiring constant close guard for pt safety. Pt will have to have 24/7 assist at home and has contact guard needs at this time, per pt her friend can provide the needed support. PT to continue to follow .    Recommendations for follow up therapy are one component of a multi-disciplinary discharge planning process, led by the attending physician.  Recommendations may be updated based on patient status, additional functional criteria and insurance authorization.  Follow Up Recommendations  Home health PT;Supervision/Assistance - 24 hour     Equipment Recommendations  None recommended by PT    Recommendations for Other Services       Precautions / Restrictions Precautions Precautions: Fall Precaution Comments: SBP <140 Restrictions Weight Bearing Restrictions: No     Mobility  Bed Mobility Overal bed mobility: Needs Assistance Bed Mobility: Supine to Sit     Supine to sit: Supervision     General bed mobility comments: for safety, increased time and use of bedrails/HOB elevation.    Transfers Overall transfer level: Needs assistance Equipment used:  None Transfers: Sit to/from Stand Sit to Stand: Min guard         General transfer comment: close guard for safety, immediately reaching for environment to self-steady.  Ambulation/Gait Ambulation/Gait assistance: Min guard;Min assist Gait Distance (Feet): 175 Feet Assistive device: None Gait Pattern/deviations: Step-through pattern;Decreased stride length;Narrow base of support;Drifts right/left Gait velocity: decr   General Gait Details: cues for upright posture, correcting LOB x1 and providing general steadying. With loss of balance pt states "that's what happens when I get distracted".   Stairs             Wheelchair Mobility    Modified Rankin (Stroke Patients Only) Modified Rankin (Stroke Patients Only) Pre-Morbid Rankin Score: No significant disability Modified Rankin: Moderately severe disability     Balance Overall balance assessment: Needs assistance Sitting-balance support: No upper extremity supported;Feet supported Sitting balance-Leahy Scale: Good     Standing balance support: No upper extremity supported;During functional activity Standing balance-Leahy Scale: Fair Standing balance comment: Standing tolerance x5 minutes with UEs supported on sink intermittently while PT helping pt with washup               High Level Balance Comments: mild impairment (mild unsteadiness but recovers): fast/slow gait; moderate impairment (weaving of gait, LOB): horizontal and vertical head turns, turn and stop            Cognition Arousal/Alertness: Awake/alert Behavior During Therapy: WFL for tasks assessed/performed Overall Cognitive Status: Impaired/Different from baseline Area of Impairment: Attention;Memory;Safety/judgement;Awareness;Problem solving;Following commands                   Current Attention Level: Sustained  Memory: Decreased short-term memory Following Commands: Follows one step commands consistently;Follows multi-step commands  inconsistently Safety/Judgement: Decreased awareness of deficits Awareness: Emergent Problem Solving: Slow processing;Requires verbal cues General Comments: Pt makes several strange comments, examples include pointing towards rooftop and stating "is that my friend's SUV behind the helicopter?" when no helicopter or SUV in sight; when reaching sterile equipment storage pt states "it is so beautiful, do we all get one?". Pt is tangential, when distracted pt often loses her balance      Exercises      General Comments        Pertinent Vitals/Pain Pain Assessment: Faces Faces Pain Scale: No hurt Pain Intervention(s): Monitored during session    Home Living                      Prior Function            PT Goals (current goals can now be found in the care plan section) Acute Rehab PT Goals Patient Stated Goal: to go home PT Goal Formulation: With patient Time For Goal Achievement: 12/05/20 Potential to Achieve Goals: Good Progress towards PT goals: Progressing toward goals    Frequency    Min 4X/week      PT Plan Current plan remains appropriate    Co-evaluation              AM-PAC PT "6 Clicks" Mobility   Outcome Measure  Help needed turning from your back to your side while in a flat bed without using bedrails?: A Little Help needed moving from lying on your back to sitting on the side of a flat bed without using bedrails?: A Little Help needed moving to and from a bed to a chair (including a wheelchair)?: A Little Help needed standing up from a chair using your arms (e.g., wheelchair or bedside chair)?: A Little Help needed to walk in hospital room?: A Little Help needed climbing 3-5 steps with a railing? : A Little 6 Click Score: 18    End of Session   Activity Tolerance: Patient tolerated treatment well Patient left: in bed;with call bell/phone within reach;with bed alarm set;with nursing/sitter in room Nurse Communication: Mobility  status PT Visit Diagnosis: Unsteadiness on feet (R26.81);Muscle weakness (generalized) (M62.81);History of falling (Z91.81)     Time: 8588-5027 PT Time Calculation (min) (ACUTE ONLY): 39 min  Charges:  $Gait Training: 8-22 mins $Self Care/Home Management: 8-22                     Stacie Glaze, PT DPT Acute Rehabilitation Services Pager [phone]  Office [phone]    [place] 11/24/2020, 12:21 PM

## 2020-11-24 NOTE — Progress Notes (Signed)
Subjective: Patient reports that she is doing well. She has no complaints other than wanting to go home. No acute events overnight.   Objective: Vital signs in last 24 hours: Temp:  [98 F (36.7 C)-98.7 F (37.1 C)] 98.3 F (36.8 C) (10/04 0731) Pulse Rate:  [72-87] 79 (10/04 0700) Resp:  [15-25] 21 (10/04 0700) BP: (132-158)/(61-110) 151/68 (10/04 0700) SpO2:  [91 %-97 %] 93 % (10/04 0700)  Intake/Output from previous day: 10/03 0701 - 10/04 0700 In: 240 [P.O.:240] Out: 1 [Urine:1] Intake/Output this shift: No intake/output data recorded.  Physical Exam: Patient is awake, A/O X 4, conversant, and in good spirits. They are in NAD and VSS. Doing well. Speech is fluent and appropriate. MAEW with good strength that is symmetric bilaterally. 5/5 BUE/BLE. Sensation to light touch is intact. PERLA, EOMI. CNs grossly intact.   Lab Results: Recent Labs    11/24/20 0224  WBC 11.0*  HGB 14.1  HCT 42.8  PLT 314   BMET Recent Labs    11/24/20 0224  NA 135  K 3.7  CL 99  CO2 25  GLUCOSE 123*  BUN 18  CREATININE 0.75  CALCIUM 9.0    Studies/Results: VAS [place] TRANSCRANIAL DOPPLER  Result Date: 11/24/2020  Transcranial Doppler Patient Name:  Melissa Wilkinson  Date of Exam:   11/23/2020 Medical Rec #: [id number]           Accession #:    [id number] Date of Birth: [birth year]           Patient Gender: F Patient Age:   75 years Exam Location:  [facility] Procedure:      VAS [place] TRANSCRANIAL DOPPLER Referring Phys: TROY DAWLEY --------------------------------------------------------------------------------  Indications: Subarachnoid hemorrhage. History: SAH within interhemispheric fissure, basal cisterns, and 4th ventricle with early communicating hydrocephalus. Limitations: Poor windows, patient confusion, distraction, and movement Limitations for diagnostic windows: Unable to insonate right transtemporal window. Unable to insonate left transtemporal window. Comparison Study: Prior  study done 11/21/20 Performing Technologist: Sharion Dove RVS  Examination Guidelines: A complete evaluation includes B-mode imaging, spectral Doppler, color Doppler, and power Doppler as needed of all accessible portions of each vessel. Bilateral testing is considered an integral part of a complete examination. Limited examinations for reoccurring indications may be performed as noted.  +----------+-------------+----------+-----------+----------------+ RIGHT TCD Right VM (cm)Depth (cm)Pulsatility    Comment      +----------+-------------+----------+-----------+----------------+ MCA                                           poor window    +----------+-------------+----------+-----------+----------------+ ACA                                           poor window    +----------+-------------+----------+-----------+----------------+ Term ICA                                      poor window    +----------+-------------+----------+-----------+----------------+ PCA                                           poor window    +----------+-------------+----------+-----------+----------------+  Opthalmic     31.00                 1.42                     +----------+-------------+----------+-----------+----------------+ ICA siphon    29.00                 1.50                     +----------+-------------+----------+-----------+----------------+ Vertebral                                   patient movement +----------+-------------+----------+-----------+----------------+  +----------+------------+----------+-----------+----------------+ LEFT TCD  Left VM (cm)Depth (cm)Pulsatility    Comment      +----------+------------+----------+-----------+----------------+ MCA                                          poor window    +----------+------------+----------+-----------+----------------+ ACA                                          poor window     +----------+------------+----------+-----------+----------------+ Term ICA                                     poor window    +----------+------------+----------+-----------+----------------+ PCA                                          poor window    +----------+------------+----------+-----------+----------------+ Opthalmic    28.00                 0.92                     +----------+------------+----------+-----------+----------------+ ICA siphon                                  not insonated   +----------+------------+----------+-----------+----------------+ Vertebral                                  patient movement +----------+------------+----------+-----------+----------------+  +------------+-----+----------------+             VM cm    Comment      +------------+-----+----------------+ Prox Basilar     patient movement +------------+-----+----------------+ Summary:  Highly limited study due to absent bitemporal and suboccipital windows. antegrade flow noted in both opthalmics and right carotid siphon. *See table(s) above for TCD measurements and observations.  Diagnosing physician: Antony Contras MD Electronically signed by Antony Contras MD on 11/24/2020 at 8:25:54 AM.    Final     Assessment/Plan: 75 y.o. female with AMS was found to have a [place]. Her imaging revealed a subarachnoid hemorrhage within the interhemispheric fissure, basal cisterns and fourth ventricle. CTA head was negative for aneurysm or other new abnormality. On examination she has some persistent confusion, but otherwise appears to be at her baseline. Due to concern over  possible aneurysmal etiology, we will continue to treat with Nimotop and SAH pathway. Plan for cerebral angiogram latter this week.  Continue supportive care and ICU care.    LOS: 4 days     Marvis Moeller, DNP, NP-C 11/24/2020, 9:04 AM

## 2020-11-24 NOTE — Progress Notes (Signed)
Occupational Therapy Treatment Patient Details Name: Melissa Wilkinson MRN: [id number] DOB: September 16, 1945 Today's Date: 11/24/2020   History of present illness 75 y/o female presented to [place] ED on 9/30 following fall with subsequent jaw pain. Confusion while in ED. 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 Transferred to [facility] ICU. PMH: anxiety, HTN, depression, GERD.  Pt also with recent frontotemporal epidural and SDH in 8/22 after sustaining a fall.   OT comments  Pt makes a comment during end of session regarding hearing voices and being ready to hide in her room at night time. Pt talking about subjects unrelated to session or topic being addressed. Pt needs redirection back to functional task. Pt given multiple steps verbally and unable to sequence without asking questions despite repeating the instructions. Pt demonstrates need for visual cues and supervision for all task. Pt high fall risk, high risk to leave the stove on and poor medication management. Recommendation for (A) for all adls initially.    Recommendations for follow up therapy are one component of a multi-disciplinary discharge planning process, led by the attending physician.  Recommendations may be updated based on patient status, additional functional criteria and insurance authorization.    Follow Up Recommendations  Home health OT;Supervision/Assistance - 24 hour    Equipment Recommendations  Tub/shower seat    Recommendations for Other Services      Precautions / Restrictions Precautions Precautions: Fall Precaution Comments: SBP <140 Restrictions Weight Bearing Restrictions: No       Mobility Bed Mobility Overal bed mobility: Needs Assistance Bed Mobility: Supine to Sit     Supine to sit: Supervision     General bed mobility comments: oob on arrival    Transfers Overall transfer level: Needs  assistance Equipment used: None Transfers: Sit to/from Stand Sit to Stand: Min guard         General transfer comment: close guard for safety, immediately reaching for environment to self-steady.    Balance Overall balance assessment: Needs assistance Sitting-balance support: Bilateral upper extremity supported;Feet supported Sitting balance-Leahy Scale: Good     Standing balance support: Single extremity supported;During functional activity Standing balance-Leahy Scale: Fair Standing balance comment: Standing tolerance x5 minutes with UEs supported on sink intermittently while PT helping pt with washup             High level balance activites: Turns High Level Balance Comments: mild impairment (mild unsteadiness but recovers): fast/slow gait; moderate impairment (weaving of gait, LOB): horizontal and vertical head turns, turn and stop           ADL either performed or assessed with clinical judgement   ADL Overall ADL's : Needs assistance/impaired                         Toilet Transfer: Min guard;Ambulation           Functional mobility during ADLs: Min guard General ADL Comments: pt needs mod cues during transfers due to poor recall of task being performed     Vision   Additional Comments: pt reports vision is blurry but did not have glasses during task.   Perception     Praxis      Cognition Arousal/Alertness: Awake/alert Behavior During Therapy: WFL for tasks assessed/performed Overall Cognitive Status: Impaired/Different from baseline Area of Impairment: Attention;Memory;Safety/judgement;Awareness;Problem solving;Following commands  Orientation Level: Disoriented to;Situation Current Attention Level: Sustained Memory: Decreased recall of precautions;Decreased short-term memory Following Commands: Follows one step commands consistently;Follows one step commands with increased time Safety/Judgement: Decreased  awareness of safety;Decreased awareness of deficits Awareness: Emergent Problem Solving: Slow processing;Difficulty sequencing General Comments: pt following 2 step command but asking questions when given any 3 step commands. pt walking past room due to talking and no recall of task being performed. pt needs cues to then recall to navigate back to her room. pt states "this dont look right. and needs to find a personal item to be sure she was in the right room" pt expressed thinking that she needed to find somewhere to hide last night because she heard voices outside her room. she states "you know watching too much tv i guess but i wanted to be ready to hide if i needed to"        Exercises     Shoulder Instructions       General Comments pt talking about spouse during session but after chart review noted pt has passed. pt talking about her spouse in present tense.    Pertinent Vitals/ Pain       Pain Assessment: No/denies pain Faces Pain Scale: No hurt Pain Intervention(s): Monitored during session  Home Living                                          Prior Functioning/Environment              Frequency  Min 2X/week        Progress Toward Goals  OT Goals(current goals can now be found in the care plan section)  Progress towards OT goals: Progressing toward goals  Acute Rehab OT Goals Patient Stated Goal: to go home OT Goal Formulation: With patient Time For Goal Achievement: 12/05/20 Potential to Achieve Goals: Good  Plan Discharge plan remains appropriate    Co-evaluation                 AM-PAC OT "6 Clicks" Daily Activity     Outcome Measure   Help from another person eating meals?: None Help from another person taking care of personal grooming?: A Little Help from another person toileting, which includes using toliet, bedpan, or urinal?: A Little Help from another person bathing (including washing, rinsing, drying)?: A Little Help  from another person to put on and taking off regular upper body clothing?: A Little Help from another person to put on and taking off regular lower body clothing?: A Little 6 Click Score: 19    End of Session    OT Visit Diagnosis: Unsteadiness on feet (R26.81);Cognitive communication deficit (R41.841)   Activity Tolerance Patient tolerated treatment well   Patient Left in chair;with call bell/phone within reach;with chair alarm set   Nurse Communication Mobility status;Precautions        Time: 1027-2536 OT Time Calculation (min): 14 min  Charges: OT General Charges $OT Visit: 1 Visit OT Treatments $Therapeutic Activity: 8-22 mins   Brynn, OTR/L  Acute Rehabilitation Services Pager: [phone] Office: [phone] .   Jeri Modena 11/24/2020, 2:06 PM

## 2020-11-25 ENCOUNTER — Inpatient Hospital Stay (HOSPITAL_COMMUNITY): Payer: Medicare Other

## 2020-11-25 DIAGNOSIS — I609 Nontraumatic subarachnoid hemorrhage, unspecified: Secondary | ICD-10-CM

## 2020-11-25 HISTORY — PX: IR ANGIO VERTEBRAL SEL VERTEBRAL BILAT MOD SED: IMG5369

## 2020-11-25 HISTORY — PX: IR ANGIO INTRA EXTRACRAN SEL INTERNAL CAROTID BILAT MOD SED: IMG5363

## 2020-11-25 MED ORDER — HEPARIN SODIUM (PORCINE) 1000 UNIT/ML IJ SOLN
INTRAMUSCULAR | Status: AC
  Filled 2020-11-25: qty 1

## 2020-11-25 MED ORDER — LIDOCAINE HCL 1 % IJ SOLN
INTRAMUSCULAR | Status: AC
  Filled 2020-11-25: qty 20

## 2020-11-25 MED ORDER — FENTANYL CITRATE (PF) 100 MCG/2ML IJ SOLN
INTRAMUSCULAR | Status: DC | PRN
  Administered 2020-11-25: 25 ug via INTRAVENOUS

## 2020-11-25 MED ORDER — HEPARIN SODIUM (PORCINE) 1000 UNIT/ML IJ SOLN
INTRAMUSCULAR | Status: DC | PRN
  Administered 2020-11-25: 2000 [IU] via INTRAVENOUS

## 2020-11-25 MED ORDER — NITROGLYCERIN 1 MG/10 ML FOR IR/CATH LAB
INTRA_ARTERIAL | Status: AC
  Filled 2020-11-25: qty 10

## 2020-11-25 MED ORDER — IOHEXOL 350 MG/ML SOLN
100.0000 mL | Freq: Once | INTRAVENOUS | Status: AC | PRN
  Administered 2020-11-25: 35 mL via INTRAVENOUS

## 2020-11-25 MED ORDER — PNEUMOCOCCAL VAC POLYVALENT 25 MCG/0.5ML IJ INJ
0.5000 mL | INJECTION | INTRAMUSCULAR | Status: AC
  Administered 2020-11-26: 0.5 mL via INTRAMUSCULAR
  Filled 2020-11-25: qty 0.5

## 2020-11-25 MED ORDER — VERAPAMIL HCL 2.5 MG/ML IV SOLN
INTRAVENOUS | Status: AC
  Filled 2020-11-25: qty 2

## 2020-11-25 MED ORDER — MIDAZOLAM HCL 2 MG/2ML IJ SOLN
INTRAMUSCULAR | Status: DC | PRN
  Administered 2020-11-25: 1 mg via INTRAVENOUS

## 2020-11-25 MED ORDER — MIDAZOLAM HCL 2 MG/2ML IJ SOLN
INTRAMUSCULAR | Status: AC
  Filled 2020-11-25: qty 2

## 2020-11-25 MED ORDER — INFLUENZA VAC A&B SA ADJ QUAD 0.5 ML IM PRSY
0.5000 mL | PREFILLED_SYRINGE | INTRAMUSCULAR | Status: AC
  Administered 2020-11-26: 0.5 mL via INTRAMUSCULAR
  Filled 2020-11-25: qty 0.5

## 2020-11-25 MED ORDER — FENTANYL CITRATE (PF) 100 MCG/2ML IJ SOLN
INTRAMUSCULAR | Status: AC
  Filled 2020-11-25: qty 2

## 2020-11-25 MED ORDER — LIDOCAINE HCL (PF) 1 % IJ SOLN
INTRAMUSCULAR | Status: DC | PRN
  Administered 2020-11-25: 10 mL

## 2020-11-25 NOTE — Progress Notes (Signed)
Subjective: Patient reports that she is doing well. She was able to sleep well overnight. No acute events overnight.   Objective: Vital signs in last 24 hours: Temp:  [97.7 F (36.5 C)-98.3 F (36.8 C)] 98.3 F (36.8 C) (10/05 0726) Pulse Rate:  [78-86] 78 (10/04 2000) Resp:  [13-30] 18 (10/05 0700) BP: (113-154)/(53-86) 144/76 (10/05 0700) SpO2:  [93 %-99 %] 94 % (10/05 0400)  Intake/Output from previous day: 10/04 0701 - 10/05 0700 In: 240 [P.O.:240] Out: -  Intake/Output this shift: No intake/output data recorded.  Physical Exam: Patient is awake, A/O X 4, conversant, and in good spirits. They are in NAD and VSS. Doing well. Speech is fluent and appropriate. MAEW with good strength that is symmetric bilaterally. 5/5 BUE/BLE. Sensation to light touch is intact. PERLA, EOMI. CNs grossly intact.   Lab Results: Recent Labs    11/24/20 0224  WBC 11.0*  HGB 14.1  HCT 42.8  PLT 314   BMET Recent Labs    11/24/20 0224  NA 135  K 3.7  CL 99  CO2 25  GLUCOSE 123*  BUN 18  CREATININE 0.75  CALCIUM 9.0    Studies/Results: VAS [place] TRANSCRANIAL DOPPLER  Result Date: 11/24/2020  Transcranial Doppler Patient Name:  JUDE LINCK  Date of Exam:   11/23/2020 Medical Rec #: [id number]           Accession #:    [id number] Date of Birth: 1945-04-05           Patient Gender: F Patient Age:   63 years Exam Location:  [facility] Procedure:      VAS [place] TRANSCRANIAL DOPPLER Referring Phys: TROY DAWLEY --------------------------------------------------------------------------------  Indications: Subarachnoid hemorrhage. History: SAH within interhemispheric fissure, basal cisterns, and 4th ventricle with early communicating hydrocephalus. Limitations: Poor windows, patient confusion, distraction, and movement Limitations for diagnostic windows: Unable to insonate right transtemporal window. Unable to insonate left transtemporal window. Comparison Study: Prior study done 11/21/20  Performing Technologist: Sharion Dove RVS  Examination Guidelines: A complete evaluation includes B-mode imaging, spectral Doppler, color Doppler, and power Doppler as needed of all accessible portions of each vessel. Bilateral testing is considered an integral part of a complete examination. Limited examinations for reoccurring indications may be performed as noted.  +----------+-------------+----------+-----------+----------------+ RIGHT TCD Right VM (cm)Depth (cm)Pulsatility    Comment      +----------+-------------+----------+-----------+----------------+ MCA                                           poor window    +----------+-------------+----------+-----------+----------------+ ACA                                           poor window    +----------+-------------+----------+-----------+----------------+ Term ICA                                      poor window    +----------+-------------+----------+-----------+----------------+ PCA                                           poor window    +----------+-------------+----------+-----------+----------------+  Opthalmic     31.00                 1.42                     +----------+-------------+----------+-----------+----------------+ ICA siphon    29.00                 1.50                     +----------+-------------+----------+-----------+----------------+ Vertebral                                   patient movement +----------+-------------+----------+-----------+----------------+  +----------+------------+----------+-----------+----------------+ LEFT TCD  Left VM (cm)Depth (cm)Pulsatility    Comment      +----------+------------+----------+-----------+----------------+ MCA                                          poor window    +----------+------------+----------+-----------+----------------+ ACA                                          poor window     +----------+------------+----------+-----------+----------------+ Term ICA                                     poor window    +----------+------------+----------+-----------+----------------+ PCA                                          poor window    +----------+------------+----------+-----------+----------------+ Opthalmic    28.00                 0.92                     +----------+------------+----------+-----------+----------------+ ICA siphon                                  not insonated   +----------+------------+----------+-----------+----------------+ Vertebral                                  patient movement +----------+------------+----------+-----------+----------------+  +------------+-----+----------------+             VM cm    Comment      +------------+-----+----------------+ Prox Basilar     patient movement +------------+-----+----------------+ Summary:  Highly limited study due to absent bitemporal and suboccipital windows. antegrade flow noted in both opthalmics and right carotid siphon. *See table(s) above for TCD measurements and observations.  Diagnosing physician: Antony Contras MD Electronically signed by Antony Contras MD on 11/24/2020 at 8:25:54 AM.    Final     Assessment/Plan: 75 y.o. female with AMS was found to have a [place]. Her imaging revealed a subarachnoid hemorrhage within the interhemispheric fissure, basal cisterns and fourth ventricle. CTA head was negative for aneurysm or other new abnormality. She has some persistent confusion, but otherwise appears to be at her baseline. Due to concern over possible aneurysmal  etiology, we will continue to treat with Nimotop and SAH pathway. Plan for diagnostic cerebral angiogram today.  Continue supportive care and ICU care. Maintain NPO status in preparation for her procedure.   LOS: 5 days     Marvis Moeller, DNP, NP-C 11/25/2020, 8:11 AM

## 2020-11-25 NOTE — Brief Op Note (Signed)
  NEUROSURGERY BRIEF OPERATIVE  NOTE   PREOP DX: Subarachnoid hemorrhage  POSTOP DX: Same  PROCEDURE: Diagnostic cerebral angiogram  SURGEON: Dr. Consuella Lose, MD  ANESTHESIA: IV Sedation with Local  EBL: Minimal  SPECIMENS: None  COMPLICATIONS: None  CONDITION: Stable to recovery  FINDINGS (Full report in [facility]): 1. Normal cerebral angiogram, no aneurysms, AVM, or fistula seen. 2. No vasospasm is seen   Consuella Lose, MD [facility]

## 2020-11-25 NOTE — Progress Notes (Signed)
   Providing Compassionate, Quality Care - Together  NEUROSURGERY PROGRESS NOTE   S: No complaints  O: EXAM:  BP 138/75 (BP Location: Left Arm)   Pulse 73   Temp 98.4 F (36.9 C) (Oral)   Resp 15   Ht 5' (1.524 m)   Wt 64.4 kg   SpO2 96%   BMI 27.73 kg/m   Awake, alert, oriented  PERRL EOMI Speech fluent, appropriate  CNs grossly intact  5/5 BUE/BLE   ASSESSMENT:  75 y.o. female with   SAH, unknown etiology  S/p DCA, negative  PLAN: - dc planning w [facility] tomorrow - doing well    Thank you for allowing me to participate in this patient's care.  Please do not hesitate to call with questions or concerns.   Elwin Sleight, [facility] Cell: [phone]

## 2020-11-25 NOTE — Progress Notes (Signed)
PT Cancellation Note  Patient Details Name: Melissa Wilkinson MRN: [id number] DOB: 1945-08-28   Cancelled Treatment:    Reason Eval/Treat Not Completed: Patient at procedure or test/unavailable - Diagnostic cerebral angiogram, will check back.  Stacie Glaze, PT DPT Acute Rehabilitation Services Pager [phone]  Office [phone]    Louis Matte 11/25/2020, 3:36 PM

## 2020-11-25 NOTE — Progress Notes (Signed)
  NEUROSURGERY PROGRESS NOTE   No issues overnight. No complaints today  EXAM:  BP 136/75   Pulse 76   Temp 98.4 F (36.9 C) (Oral)   Resp 16   Ht 5' (1.524 m)   Wt 64.4 kg   SpO2 100%   BMI 27.73 kg/m   Awake, alert, oriented  Speech fluent, appropriate  CN grossly intact  5/5 BUE/BLE   IMPRESSION:  75 y.o. female with SAH, initial CTA negative. Remains neurologically intact.  PLAN: - Confirmatory angiogram today   Consuella Lose, MD [facility]

## 2020-11-25 NOTE — Progress Notes (Signed)
Transcranial Doppler   Date POD PCO2 HCT BP   MCA ACA PCA OPHT SIPH VERT Basilar  10/1 JH         Right  Left   *  *   *  *   *  *   12  11   *  23   -13  -10   -22       10/3CK          Right  Left    *   *    *   *    *   *    31   28    29    *    *   *    *       10/5 RH         Right  Left    *   *    *   *    *   *    24   22    *   40    -14   -16    *                   Right  Left                                                                 Right  Left                                                               Right  Left                                                               Right  Left                                                       MCA = Middle Cerebral Artery      OPHT = Opthalmic Artery     BASILAR = Basilar Artery   ACA = Anterior Cerebral Artery     SIPH = Carotid Siphon PCA = Posterior Cerebral Artery   VERT = Verterbral Artery                    Normal MCA = 62+\-12 ACA = 50+\-12 PCA = 42+\-23  Unable to obtain Lindegaard ratio   Darlin Coco, RDMS, RVT

## 2020-11-25 NOTE — Sedation Documentation (Signed)
Patient transported to 4N ICU. RN staff to the bedside to receive patient. Groin site assessed. Pink, warm and dry. No drainage noted from dressing. Soft to touch upon palpation, no hematoma noted. Palpable +2 DP pulse distal

## 2020-11-25 NOTE — Progress Notes (Signed)
TCD study attempted. Patient receiving RN care. Will attempt again later as schedule permits.  Darlin Coco, RDMS, RVT

## 2020-11-25 NOTE — Sedation Documentation (Signed)
Closure device failed to deploy. Holding manual pressure x 10 min

## 2020-11-26 ENCOUNTER — Other Ambulatory Visit (HOSPITAL_COMMUNITY): Payer: Medicare Other

## 2020-11-26 NOTE — Progress Notes (Signed)
Subjective: Patient reports that she is doing well. She has no complaints. No acute events overnight.   Objective: Vital signs in last 24 hours: Temp:  [97.7 F (36.5 C)-98.6 F (37 C)] 98.6 F (37 C) (10/06 0400) Pulse Rate:  [69-85] 79 (10/06 0700) Resp:  [11-25] 13 (10/06 0700) BP: (87-153)/(54-91) 121/62 (10/06 0700) SpO2:  [91 %-100 %] 92 % (10/06 0700)  Intake/Output from previous day: 10/05 0701 - 10/06 0700 In: 240 [P.O.:240] Out: 600 [Urine:600] Intake/Output this shift: No intake/output data recorded.  Physical Exam: Patient is awake, A/O X 4, conversant, and in good spirits. They are in NAD and VSS. Doing well. Speech is fluent and appropriate. MAEW with good strength that is symmetric bilaterally. 5/5 BUE/BLE. Sensation to light touch is intact. PERLA, EOMI. CNs grossly intact. Right femoral groin site Level 0. Dressing CDI  Lab Results: Recent Labs    11/24/20 0224  WBC 11.0*  HGB 14.1  HCT 42.8  PLT 314   BMET Recent Labs    11/24/20 0224  NA 135  K 3.7  CL 99  CO2 25  GLUCOSE 123*  BUN 18  CREATININE 0.75  CALCIUM 9.0    Studies/Results: VAS [place] TRANSCRANIAL DOPPLER  Result Date: 11/25/2020  Transcranial Doppler Patient Name:  Melissa Wilkinson  Date of Exam:   11/25/2020 Medical Rec #: [id number]           Accession #:    [id number] Date of Birth: Oct 21, 1945           Patient Gender: F Patient Age:   75 years Exam Location:  [facility] Procedure:      VAS [place] TRANSCRANIAL DOPPLER Referring Phys: TROY DAWLEY --------------------------------------------------------------------------------  Indications: Subarachnoid hemorrhage. Comparison Study: Prior 11/23/2020 TCD limited. Performing Technologist: Darlin Coco RDMS, RVT  Examination Guidelines: A complete evaluation includes B-mode imaging, spectral Doppler, color Doppler, and power Doppler as needed of all accessible portions of each vessel. Bilateral testing is considered an integral part of a  complete examination. Limited examinations for reoccurring indications may be performed as noted.  +---------+-------------+----------+-----------+------------------+ RIGHT TCDRight VM (cm)Depth (cm)Pulsatility     Comment       +---------+-------------+----------+-----------+------------------+ MCA                                        Unable to insonate +---------+-------------+----------+-----------+------------------+ ACA                                        Unable to insonate +---------+-------------+----------+-----------+------------------+ Term ICA                                   Unable to insonate +---------+-------------+----------+-----------+------------------+ PCA                                        Unable to insonate +---------+-------------+----------+-----------+------------------+ Opthalmic    24.00                 1.05                       +---------+-------------+----------+-----------+------------------+ Vertebral   -14.00  0.91                       +---------+-------------+----------+-----------+------------------+  +----------+------------+----------+-----------+------------------+ LEFT TCD  Left VM (cm)Depth (cm)Pulsatility     Comment       +----------+------------+----------+-----------+------------------+ MCA                                        Unable to insonate +----------+------------+----------+-----------+------------------+ ACA                                        Unable to insonate +----------+------------+----------+-----------+------------------+ Term ICA                                   Unable to insonate +----------+------------+----------+-----------+------------------+ PCA                                        Unable to insonate +----------+------------+----------+-----------+------------------+ Opthalmic    22.00                 1.13                        +----------+------------+----------+-----------+------------------+ ICA siphon   40.00                 0.81                       +----------+------------+----------+-----------+------------------+ Vertebral    -16.00                1.15                       +----------+------------+----------+-----------+------------------+  +------------+-----+------------------+             VM cm     Comment       +------------+-----+------------------+ Prox Basilar     Unable to insonate +------------+-----+------------------+ Dist Basilar     Unable to insonate +------------+-----+------------------+    Preliminary    IR ANGIO INTRA EXTRACRAN SEL INTERNAL CAROTID BILAT MOD SED  Result Date: 11/25/2020 PROCEDURE: DIAGNOSTIC CEREBRAL ANGIOGRAM HISTORY: The patient is a 75 year old woman admitted to the hospital with headache. She did suffer a fall about a week ago. Her CT scan did demonstrate subarachnoid hemorrhage although in a basal distribution not typical for traumatic etiology. Initial CT angiogram was negative. She has been monitored in the intensive care unit without change in her neurologic condition. She presents today for routine confirmatory angiogram. ACCESS: The technical aspects of the procedure as well as its potential risks and benefits were reviewed with the patient. These risks included but were not limited bleeding, infection, allergic reaction, damage to organs or vital structures, stroke, non-diagnostic procedure, and the catastrophic outcomes of heart attack, coma, and death. With an understanding of these risks, informed consent was obtained and witnessed. The patient was placed in the supine position on the angiography table and the skin of right groin prepped in the usual sterile fashion. The procedure was performed under local anesthesia (1%-solution of bicarbonate-buffered Lidocaine) and conscious sedation with 1mg  versed and 27micrograms fentanyl monitored by myself  and  the in-suite nurse using continuous pulse-oximetry, heart rate, and non-invasive blood-pressure. A 5- French sheath was introduced in the right common femoral artery using Seldinger technique. A fluoro-phase sequence was used to document the sheath position. MEDICATIONS: HEPARIN: 2000 Units total. CONTRAST:  cc, Omnipaque 300 FLUOROSCOPY TIME:  FLUOROSCOPY TIME: See IR records TECHNIQUE: CATHETERS AND WIRES 5-French JB-1 catheter 0.035" glidewire VESSELS CATHETERIZED Right internal carotid Left internal carotid Left vertebral Right common femoral VESSELS STUDIED Right internal carotid, head Left internal carotid, head Left vertebral Right femoral PROCEDURAL NARRATIVE A 5-Fr JB-1 catheter was advanced over a 0.035 glidewire into the aortic arch. The above vessels were then sequentially catheterized and cervical / cerebral angiograms taken. After review of images, the catheter was removed without incident. FINDINGS: Right internal carotid, head: Injection reveals the presence of a widely patent ICA, M1, and A1 segments and their branches. No aneurysms, AVMs, or high-flow fistulas are seen. No vasospasm of the right carotid circulation is seen. The parenchymal and venous phases are normal. The venous sinuses are widely patent. Left internal carotid, head: Injection reveals the presence of a widely patent ICA, A1, and M1 segments and their branches. No aneurysms, AVMs, or high-flow fistulas are seen. Note is made of a fetal type left posterior cerebral artery. No vasospasm of the left carotid circulation is seen. The parenchymal and venous phases are normal. The venous sinuses are widely patent. Left vertebral: Injection reveals the presence of a widely patent vertebral artery. This leads to a widely patent basilar artery that terminates in right P1. The basilar apex is normal. No aneurysms, AVMs, or high-flow fistulas are seen. There is contrast reflux down the distal segment of the right vertebral artery. No aneurysms  of the distal right vertebral artery identified. There is no vasospasm of the posterior circulation. The parenchymal and venous phases are normal. The venous sinuses are widely patent. Right femoral: Normal vessel. No significant atherosclerotic disease. Arterial sheath in adequate position. DISPOSITION: Upon completion of the study, the femoral sheath was removed and hemostasis obtained by manual compression. Good proximal and distal lower extremity pulses were documented upon achievement of hemostasis. The procedure was well tolerated and no early complications were observed. The patient was transferred to the holding area to lay flat for 2 hours. IMPRESSION: 1. Normal cerebral angiogram without identified intracranial aneurysm, arteriovenous malformation, or high-flow fistula. The preliminary results of this procedure were shared with the patient and the patient's family. Electronically Signed   By: Consuella Lose   On: 11/25/2020 15:19   IR ANGIO VERTEBRAL SEL VERTEBRAL BILAT MOD SED  Result Date: 11/25/2020 PROCEDURE: DIAGNOSTIC CEREBRAL ANGIOGRAM HISTORY: The patient is a 75 year old woman admitted to the hospital with headache. She did suffer a fall about a week ago. Her CT scan did demonstrate subarachnoid hemorrhage although in a basal distribution not typical for traumatic etiology. Initial CT angiogram was negative. She has been monitored in the intensive care unit without change in her neurologic condition. She presents today for routine confirmatory angiogram. ACCESS: The technical aspects of the procedure as well as its potential risks and benefits were reviewed with the patient. These risks included but were not limited bleeding, infection, allergic reaction, damage to organs or vital structures, stroke, non-diagnostic procedure, and the catastrophic outcomes of heart attack, coma, and death. With an understanding of these risks, informed consent was obtained and witnessed. The patient was  placed in the supine position on the angiography table and the skin of right groin  prepped in the usual sterile fashion. The procedure was performed under local anesthesia (1%-solution of bicarbonate-buffered Lidocaine) and conscious sedation with 1mg  versed and 63micrograms fentanyl monitored by myself and the in-suite nurse using continuous pulse-oximetry, heart rate, and non-invasive blood-pressure. A 5- French sheath was introduced in the right common femoral artery using Seldinger technique. A fluoro-phase sequence was used to document the sheath position. MEDICATIONS: HEPARIN: 2000 Units total. CONTRAST:  cc, Omnipaque 300 FLUOROSCOPY TIME:  FLUOROSCOPY TIME: See IR records TECHNIQUE: CATHETERS AND WIRES 5-French JB-1 catheter 0.035" glidewire VESSELS CATHETERIZED Right internal carotid Left internal carotid Left vertebral Right common femoral VESSELS STUDIED Right internal carotid, head Left internal carotid, head Left vertebral Right femoral PROCEDURAL NARRATIVE A 5-Fr JB-1 catheter was advanced over a 0.035 glidewire into the aortic arch. The above vessels were then sequentially catheterized and cervical / cerebral angiograms taken. After review of images, the catheter was removed without incident. FINDINGS: Right internal carotid, head: Injection reveals the presence of a widely patent ICA, M1, and A1 segments and their branches. No aneurysms, AVMs, or high-flow fistulas are seen. No vasospasm of the right carotid circulation is seen. The parenchymal and venous phases are normal. The venous sinuses are widely patent. Left internal carotid, head: Injection reveals the presence of a widely patent ICA, A1, and M1 segments and their branches. No aneurysms, AVMs, or high-flow fistulas are seen. Note is made of a fetal type left posterior cerebral artery. No vasospasm of the left carotid circulation is seen. The parenchymal and venous phases are normal. The venous sinuses are widely patent. Left vertebral:  Injection reveals the presence of a widely patent vertebral artery. This leads to a widely patent basilar artery that terminates in right P1. The basilar apex is normal. No aneurysms, AVMs, or high-flow fistulas are seen. There is contrast reflux down the distal segment of the right vertebral artery. No aneurysms of the distal right vertebral artery identified. There is no vasospasm of the posterior circulation. The parenchymal and venous phases are normal. The venous sinuses are widely patent. Right femoral: Normal vessel. No significant atherosclerotic disease. Arterial sheath in adequate position. DISPOSITION: Upon completion of the study, the femoral sheath was removed and hemostasis obtained by manual compression. Good proximal and distal lower extremity pulses were documented upon achievement of hemostasis. The procedure was well tolerated and no early complications were observed. The patient was transferred to the holding area to lay flat for 2 hours. IMPRESSION: 1. Normal cerebral angiogram without identified intracranial aneurysm, arteriovenous malformation, or high-flow fistula. The preliminary results of this procedure were shared with the patient and the patient's family. Electronically Signed   By: Consuella Lose   On: 11/25/2020 15:19    Assessment/Plan: 75 y.o. female with AMS was found to have a SAH. Her imaging revealed a subarachnoid hemorrhage within the interhemispheric fissure, basal cisterns and fourth ventricle. CTA head was negative for aneurysm or other new abnormality. She had some confusion, but otherwise remained at her baseline. Due to concern over possible aneurysmal etiology, she was monitored in the ICU, treated with Nimotop and SAH pathway as well as diagnostic cerebral angiogram. Diagnostic cerebral angiogram reveled no aneurysms, AVM, fistula, or vasospasm. She is stable for discharge at this time. Continue supportive care. Continue working with therapies. Will plan for  discharge today.   Follow up as an outpatient patient with Dr. Kathyrn Sheriff in 2 weeks.  Home health PT, OT, SLP   LOS: 6 days     Vonna Kotyk L  Glenford Peers, [place], NP-C 11/26/2020, 8:02 AM

## 2020-11-26 NOTE — Discharge Summary (Addendum)
Physician Discharge Summary  Patient ID: Melissa Wilkinson MRN: [id number] DOB/AGE: 1945-05-13 75 y.o.  Admit date: 11/20/2020 Discharge date: 11/26/2020  Admission Diagnoses: Subarachnoid hemorrhage  Discharge Diagnoses: Subarachnoid hemorrhage Active Problems:   SAH (subarachnoid hemorrhage) (HCC)   Subarachnoid hemorrhage ([place])   Discharged Condition: good  Hospital Course: The patient was admitted on 11/20/2020 after being BIB EMS due to [place]. Her imaging revealed a subarachnoid hemorrhage within the interhemispheric fissure, basal cisterns and fourth ventricle. CTA head was negative for aneurysm or other new abnormality. She had some confusion, but otherwise remained at her baseline. Due to concern over possible aneurysmal etiology, she was monitored in the ICU, treated with Nimotop and SAH pathway as well as diagnostic cerebral angiogram. Diagnostic cerebral angiogram reveled no aneurysms, AVM, fistula, or vasospasm. There were no complications. The wound remained clean dry and intact. No complaints of new neurological deficits. The patient remained afebrile with stable vital signs, and tolerated a regular diet. The patient continued to increase activities. She is stable for discharge at this time.  Consults: None  Significant Diagnostic Studies: angiography: Diagnostic cerebral angiogram  Treatments: surgery: Diagnostic cerebral angiogram  Discharge Exam: Blood pressure 133/71, pulse 73, temperature 98.6 F (37 C), temperature source Axillary, resp. rate 20, height 5' (1.524 m), weight 64.4 kg, SpO2 97 %.  Physical Exam: Patient is awake, A/O X 4, conversant, and in good spirits. They are in NAD and VSS. Doing well. Speech is fluent and appropriate. MAEW with good strength that is symmetric bilaterally. 5/5 BUE/BLE. Sensation to light touch is intact. PERLA, EOMI. CNs grossly intact. Right femoral groin site  Dressing CDI  Disposition: Discharge disposition: 01-Home or Self  Care       Discharge Instructions     Face-to-face encounter (required for Medicare/Medicaid patients)   Complete by: As directed    I Marvis Moeller certify that this patient is under my care and that I, or a nurse practitioner or physician's assistant working with me, had a face-to-face encounter that meets the physician face-to-face encounter requirements with this patient on 11/26/2020. The encounter with the patient was in whole, or in part for the following medical condition(s) which is the primary reason for home health care (List medical condition): [facility]   The encounter with the patient was in whole, or in part, for the following medical condition, which is the primary reason for home health care: [facility]   I certify that, based on my findings, the following services are medically necessary home health services:  Physical therapy Speech language pathology     Reason for Medically Necessary Home Health Services:  Therapy- Skilled Evaluation of Speech Comprehension and Safe Swallowing Therapy- Home Adaptation to Facilitate Safety Therapy- Therapeutic Exercises to Increase Strength and Endurance     My clinical findings support the need for the above services: Cognitive impairments, dementia, or mental confusion  that make it unsafe to leave home   Further, I certify that my clinical findings support that this patient is homebound due to: Mental confusion   Home Health   Complete by: As directed    To provide the following care/treatments:  PT OT SLP        Allergies as of 11/26/2020       Reactions   Hydrocodone Nausea And Vomiting   Morphine And Related Hives, Other (See Comments)   Fever        Medication List     TAKE these medications    ALPRAZolam 1  MG tablet Commonly known as: XANAX Take 0.5-1 mg by mouth at bedtime.   buPROPion 150 MG 24 hr tablet Commonly known as: WELLBUTRIN XL Take 150 mg by mouth every morning.   diclofenac sodium 1 % Gel Commonly  known as: VOLTAREN APPLY 2 GRAMS TO AFFECTED AREA 4 TIMES A DAY What changed: See the new instructions.   DULoxetine 60 MG capsule Commonly known as: CYMBALTA Take 60 mg by mouth daily.   fluticasone 50 MCG/ACT nasal spray Commonly known as: FLONASE Place 2 sprays into the nose daily as needed for allergies. For allergy symptoms   Fluticasone-Salmeterol 250-50 MCG/DOSE Aepb Commonly known as: ADVAIR Inhale 1 puff into the lungs daily as needed (asthma).   ketotifen 0.025 % ophthalmic solution Commonly known as: ZADITOR Place 2 drops into both eyes daily as needed (allergies).   methocarbamol 500 MG tablet Commonly known as: ROBAXIN Take 1 tablet (500 mg total) by mouth every 6 (six) hours as needed for muscle spasms.   ondansetron 4 MG tablet Commonly known as: Zofran Take 1 tablet (4 mg total) by mouth every 8 (eight) hours as needed for nausea or vomiting.   oxyCODONE-acetaminophen 5-325 MG tablet Commonly known as: Roxicet Take 1-2 tablets by mouth every 4 (four) hours as needed for severe pain.   oxyCODONE-acetaminophen 5-325 MG tablet Commonly known as: PERCOCET/ROXICET Take 1-2 tablets by mouth every 4 (four) hours as needed for severe pain.   predniSONE 10 MG (21) Tbpk tablet Commonly known as: STERAPRED UNI-PAK 21 TAB Take as directed What changed:  how much to take how to take this when to take this additional instructions   traMADol 50 MG tablet Commonly known as: ULTRAM Take 1 tablet (50 mg total) by mouth 2 (two) times daily as needed.   Valtrex 500 MG tablet Generic drug: valACYclovir Take 1 tablet (500 mg total) by mouth daily.         Signed: Marvis Moeller, DNP, NP-C 11/26/2020, 8:45 AM

## 2020-11-26 NOTE — Progress Notes (Signed)
Physical Therapy Treatment Patient Details Name: Melissa Wilkinson MRN: [id number] DOB: [birth year] Today's Date: 11/26/2020   History of Present Illness 75 y/o female presented to [facility] ED on 9/30 following fall with subsequent jaw pain. Confusion while in ED. 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 Transferred to [facility] ICU. PMH: anxiety, HTN, depression, GERD.  Pt also with recent frontotemporal epidural and SDH in 8/22 after sustaining a fall.    PT Comments    Patient continues to require assist for mobility with no AD and no awareness for LOBs during ambulation. Patient able to verbalize directions back to room and state room number, however when approaching room number, patient unable to identify room number but does recognize RN in room stating "this is my room". Educated patient on need for assistance with mobility due to balance deficits and increased risk of falls, patient with no awareness to need for assistance for safety giving PT the "shhh" sign with hands while RN present. D/c plan remains appropriate and patient will require 24 hour assistance for safety and balance.     Recommendations for follow up therapy are one component of a multi-disciplinary discharge planning process, led by the attending physician.  Recommendations may be updated based on patient status, additional functional criteria and insurance authorization.  Follow Up Recommendations  Home health PT;Supervision/Assistance - 24 hour     Equipment Recommendations  None recommended by PT    Recommendations for Other Services       Precautions / Restrictions Precautions Precautions: Fall Precaution Comments: SBP <140 Restrictions Weight Bearing Restrictions: No     Mobility  Bed Mobility Overal bed mobility: Needs Assistance Bed Mobility: Supine to Sit     Supine to sit: Supervision;HOB elevated           Transfers Overall transfer level: Needs assistance Equipment used: None Transfers: Sit to/from Stand Sit to Stand: Min guard         General transfer comment: close guard for safety, immediately reaching for environment to self-steady.  Ambulation/Gait Ambulation/Gait assistance: Min guard;Min assist Gait Distance (Feet): 200 Feet Assistive device: None Gait Pattern/deviations: Step-through pattern;Decreased stride length;Narrow base of support;Drifts right/left Gait velocity: decr   General Gait Details: reaching for hand rail in hallway outside of BOS resutling in LOB, patient unaware. 2-3 more LOB during ambulation requiring minA to recover with no awareness. Denies LOB throughout ambulation   Stairs             Wheelchair Mobility    Modified Rankin (Stroke Patients Only) Modified Rankin (Stroke Patients Only) Pre-Morbid Rankin Score: No significant disability Modified Rankin: Moderately severe disability     Balance Overall balance assessment: Needs assistance Sitting-balance support: Bilateral upper extremity supported;Feet supported Sitting balance-Leahy Scale: Good     Standing balance support: During functional activity;No upper extremity supported Standing balance-Leahy Scale: Poor Standing balance comment: requires minA to recover from [place]: Awake/alert Behavior During Therapy: WFL for tasks assessed/performed Overall Cognitive Status: Impaired/Different from baseline Area of Impairment: Attention;Memory;Safety/judgement;Awareness;Problem solving;Following commands                   Current Attention Level: Sustained Memory: Decreased recall of precautions;Decreased short-term memory Following Commands: Follows one step commands consistently;Follows one step commands with increased  time Safety/Judgement: Decreased awareness of safety;Decreased awareness of  deficits Awareness: Emergent Problem Solving: Slow processing;Difficulty sequencing General Comments: able to provide directions for finding room but when approaching room, unable to remember room number. Able to recognize room by RN present. Poor safety awareness. PT brought attention to LOBs, patient stating "no I didn't" and giving "shhh" gesture.      Exercises      General Comments        Pertinent Vitals/Pain Pain Assessment: No/denies pain    Home Living                      Prior Function            PT Goals (current goals can now be found in the care plan section) Acute Rehab PT Goals Patient Stated Goal: to go home PT Goal Formulation: With patient Time For Goal Achievement: 12/05/20 Potential to Achieve Goals: Good Progress towards PT goals: Progressing toward goals    Frequency    Min 4X/week      PT Plan Current plan remains appropriate    Co-evaluation              AM-PAC PT "6 Clicks" Mobility   Outcome Measure  Help needed turning from your back to your side while in a flat bed without using bedrails?: A Little Help needed moving from lying on your back to sitting on the side of a flat bed without using bedrails?: A Little Help needed moving to and from a bed to a chair (including a wheelchair)?: A Little Help needed standing up from a chair using your arms (e.g., wheelchair or bedside chair)?: A Little Help needed to walk in hospital room?: A Little Help needed climbing 3-5 steps with a railing? : A Little 6 Click Score: 18    End of Session Equipment Utilized During Treatment: Gait belt Activity Tolerance: Patient tolerated treatment well Patient left: in chair;with call bell/phone within reach;with nursing/sitter in room Nurse Communication: Mobility status PT Visit Diagnosis: Unsteadiness on feet (R26.81);Muscle weakness (generalized) (M62.81);History of falling (Z91.81)     Time: 3825-0539 PT Time Calculation (min)  (ACUTE ONLY): 16 min  Charges:  $Gait Training: 8-22 mins                     Phelan Schadt A. Gilford Rile PT, DPT Acute Rehabilitation Services Pager [phone] Office [phone]    Linna Hoff 11/26/2020, 9:47 AM

## 2020-11-28 DIAGNOSIS — S066X0D Traumatic subarachnoid hemorrhage without loss of consciousness, subsequent encounter: Secondary | ICD-10-CM | POA: Diagnosis not present

## 2020-11-28 DIAGNOSIS — K219 Gastro-esophageal reflux disease without esophagitis: Secondary | ICD-10-CM | POA: Diagnosis not present

## 2020-11-28 DIAGNOSIS — F419 Anxiety disorder, unspecified: Secondary | ICD-10-CM | POA: Diagnosis not present

## 2020-11-28 DIAGNOSIS — W19XXXD Unspecified fall, subsequent encounter: Secondary | ICD-10-CM | POA: Diagnosis not present

## 2020-11-28 DIAGNOSIS — M1990 Unspecified osteoarthritis, unspecified site: Secondary | ICD-10-CM | POA: Diagnosis not present

## 2020-11-28 DIAGNOSIS — Z9181 History of falling: Secondary | ICD-10-CM | POA: Diagnosis not present

## 2020-11-28 DIAGNOSIS — Z7951 Long term (current) use of inhaled steroids: Secondary | ICD-10-CM | POA: Diagnosis not present

## 2020-11-28 DIAGNOSIS — D6851 Activated protein C resistance: Secondary | ICD-10-CM | POA: Diagnosis not present

## 2020-11-28 DIAGNOSIS — I69015 Cognitive social or emotional deficit following nontraumatic subarachnoid hemorrhage: Secondary | ICD-10-CM | POA: Diagnosis not present

## 2020-11-28 DIAGNOSIS — F32A Depression, unspecified: Secondary | ICD-10-CM | POA: Diagnosis not present

## 2020-11-28 DIAGNOSIS — I10 Essential (primary) hypertension: Secondary | ICD-10-CM | POA: Diagnosis not present

## 2020-12-10 DIAGNOSIS — I609 Nontraumatic subarachnoid hemorrhage, unspecified: Secondary | ICD-10-CM | POA: Diagnosis not present

## 2021-03-02 DIAGNOSIS — F419 Anxiety disorder, unspecified: Secondary | ICD-10-CM | POA: Diagnosis not present

## 2021-03-02 DIAGNOSIS — I10 Essential (primary) hypertension: Secondary | ICD-10-CM | POA: Diagnosis not present

## 2021-03-03 DIAGNOSIS — I609 Nontraumatic subarachnoid hemorrhage, unspecified: Secondary | ICD-10-CM | POA: Diagnosis not present

## 2021-03-08 DIAGNOSIS — I10 Essential (primary) hypertension: Secondary | ICD-10-CM | POA: Diagnosis not present

## 2021-03-08 DIAGNOSIS — Z6829 Body mass index (BMI) 29.0-29.9, adult: Secondary | ICD-10-CM | POA: Diagnosis not present

## 2021-03-08 DIAGNOSIS — I609 Nontraumatic subarachnoid hemorrhage, unspecified: Secondary | ICD-10-CM | POA: Diagnosis not present

## 2021-03-31 DIAGNOSIS — I609 Nontraumatic subarachnoid hemorrhage, unspecified: Secondary | ICD-10-CM | POA: Diagnosis not present

## 2021-04-07 DIAGNOSIS — I609 Nontraumatic subarachnoid hemorrhage, unspecified: Secondary | ICD-10-CM | POA: Diagnosis not present

## 2021-04-09 DIAGNOSIS — I609 Nontraumatic subarachnoid hemorrhage, unspecified: Secondary | ICD-10-CM | POA: Diagnosis not present

## 2021-04-14 DIAGNOSIS — I609 Nontraumatic subarachnoid hemorrhage, unspecified: Secondary | ICD-10-CM | POA: Diagnosis not present

## 2021-04-16 ENCOUNTER — Other Ambulatory Visit: Payer: Self-pay | Admitting: Oral Surgery

## 2021-04-16 DIAGNOSIS — D1039 Benign neoplasm of other parts of mouth: Secondary | ICD-10-CM | POA: Diagnosis not present

## 2021-05-05 DIAGNOSIS — I609 Nontraumatic subarachnoid hemorrhage, unspecified: Secondary | ICD-10-CM | POA: Diagnosis not present

## 2021-05-13 DIAGNOSIS — H43813 Vitreous degeneration, bilateral: Secondary | ICD-10-CM | POA: Diagnosis not present

## 2021-05-13 DIAGNOSIS — H2513 Age-related nuclear cataract, bilateral: Secondary | ICD-10-CM | POA: Diagnosis not present

## 2021-05-13 DIAGNOSIS — H25042 Posterior subcapsular polar age-related cataract, left eye: Secondary | ICD-10-CM | POA: Diagnosis not present

## 2021-05-18 DIAGNOSIS — I609 Nontraumatic subarachnoid hemorrhage, unspecified: Secondary | ICD-10-CM | POA: Diagnosis not present

## 2021-05-24 DIAGNOSIS — I609 Nontraumatic subarachnoid hemorrhage, unspecified: Secondary | ICD-10-CM | POA: Diagnosis not present

## 2021-06-07 DIAGNOSIS — I609 Nontraumatic subarachnoid hemorrhage, unspecified: Secondary | ICD-10-CM | POA: Diagnosis not present

## 2021-06-09 DIAGNOSIS — I609 Nontraumatic subarachnoid hemorrhage, unspecified: Secondary | ICD-10-CM | POA: Diagnosis not present

## 2021-06-14 DIAGNOSIS — I609 Nontraumatic subarachnoid hemorrhage, unspecified: Secondary | ICD-10-CM | POA: Diagnosis not present

## 2021-06-16 DIAGNOSIS — I609 Nontraumatic subarachnoid hemorrhage, unspecified: Secondary | ICD-10-CM | POA: Diagnosis not present

## 2021-06-30 DIAGNOSIS — I609 Nontraumatic subarachnoid hemorrhage, unspecified: Secondary | ICD-10-CM | POA: Diagnosis not present

## 2021-07-07 DIAGNOSIS — I609 Nontraumatic subarachnoid hemorrhage, unspecified: Secondary | ICD-10-CM | POA: Diagnosis not present

## 2021-07-13 DIAGNOSIS — I609 Nontraumatic subarachnoid hemorrhage, unspecified: Secondary | ICD-10-CM | POA: Diagnosis not present

## 2021-07-20 DIAGNOSIS — I609 Nontraumatic subarachnoid hemorrhage, unspecified: Secondary | ICD-10-CM | POA: Diagnosis not present

## 2021-07-26 DIAGNOSIS — I609 Nontraumatic subarachnoid hemorrhage, unspecified: Secondary | ICD-10-CM | POA: Diagnosis not present

## 2021-09-28 DIAGNOSIS — I10 Essential (primary) hypertension: Secondary | ICD-10-CM | POA: Diagnosis not present

## 2021-09-28 DIAGNOSIS — F419 Anxiety disorder, unspecified: Secondary | ICD-10-CM | POA: Diagnosis not present

## 2021-10-04 ENCOUNTER — Other Ambulatory Visit: Payer: Self-pay | Admitting: *Deleted

## 2021-10-04 NOTE — Patient Outreach (Signed)
  Care Coordination   10/04/2021 Name: Melissa Wilkinson MRN: [id number] DOB: October 24, 1945   Care Coordination Outreach Attempts:  An unsuccessful telephone outreach was attempted today to offer the patient information about available care coordination services as a benefit of their health plan.   Follow Up Plan:  Additional outreach attempts will be made to offer the patient care coordination information and services.   Encounter Outcome:  No Answer  Care Coordination Interventions Activated:  No   Care Coordination Interventions:  No, not indicated    Raina Mina, RN Care Management Coordinator [place] Office [phone]

## 2021-10-13 ENCOUNTER — Encounter: Payer: Self-pay | Admitting: *Deleted

## 2021-10-13 ENCOUNTER — Other Ambulatory Visit: Payer: Self-pay | Admitting: *Deleted

## 2021-10-13 NOTE — Patient Outreach (Signed)
  Care Coordination   Initial Visit Note   10/13/2021 Name: ELIKA GODAR MRN: [id number] DOB: 1945-09-19  BREIONNA PUNT is a 76 y.o. year old female who sees [place] [place] for primary care. I spoke with  Ned Card by phone today  What matters to the patients health and wellness today?  No further follow up required    Goals Addressed               This Visit's Progress     No further follow up needs (pt-stated)        Care Coordination Interventions: Reviewed medications with patient and discussed purpose of all medications and updated medication pt is no long taking. Reviewed scheduled/upcoming provider appointments including all pending appointments and verified pt has her AWV on 10/29/2021 with her primary provider. Screening for signs and symptoms of depression related to chronic disease state  Assessed social determinant of health barriers         SDOH assessments and interventions completed:  Yes  SDOH Interventions Today    Flowsheet Row Most Recent Value  SDOH Interventions   Food Insecurity Interventions Intervention Not Indicated  Housing Interventions Intervention Not Indicated  Transportation Interventions Intervention Not Indicated        Care Coordination Interventions Activated:  Yes  Care Coordination Interventions:  Yes, provided   Follow up plan: No further intervention required.   Encounter Outcome:  Pt. Visit Completed   Raina Mina, RN Care Management Coordinator [place] Office [phone]

## 2021-10-13 NOTE — Patient Instructions (Signed)
Visit Information  Thank you for taking time to visit with me today. Please don't hesitate to contact me if I can be of assistance to you.   Following are the goals we discussed today:   Goals Addressed               This Visit's Progress     No further follow up needs (pt-stated)        Care Coordination Interventions: Reviewed medications with patient and discussed purpose of all medications and updated medication pt is no long taking. Reviewed scheduled/upcoming provider appointments including all pending appointments and verified pt has her AWV on 10/29/2021 with her primary provider. Screening for signs and symptoms of depression related to chronic disease state  Assessed social determinant of health barriers          Please call the care guide team at [phone] if you need to cancel or reschedule your appointment.   If you are experiencing a Mental Health or [place] or need someone to talk to, please call the Suicide and Crisis Lifeline: 988  Patient verbalizes understanding of instructions and care plan provided today and agrees to view in [place]. Active MyChart status and patient understanding of how to access instructions and care plan via MyChart confirmed with patient.     No further follow up required: No needs  Raina Mina, RN Care Management Coordinator [place] Office [phone]

## 2022-03-14 DIAGNOSIS — F419 Anxiety disorder, unspecified: Secondary | ICD-10-CM | POA: Diagnosis not present

## 2022-03-14 DIAGNOSIS — Z1211 Encounter for screening for malignant neoplasm of colon: Secondary | ICD-10-CM | POA: Diagnosis not present

## 2022-03-14 DIAGNOSIS — I10 Essential (primary) hypertension: Secondary | ICD-10-CM | POA: Diagnosis not present

## 2022-03-14 DIAGNOSIS — E785 Hyperlipidemia, unspecified: Secondary | ICD-10-CM | POA: Diagnosis not present

## 2022-10-28 IMAGING — CT CT HEAD W/O CM
4 series · 16 of 47 positions shown, 18 images · non-contrast
Comparison: 01/11/2020

CLINICAL DATA: Recent fall

EXAM:
CT HEAD WITHOUT CONTRAST
TECHNIQUE: Contiguous axial images were obtained from the base of the skull
through the vertex without intravenous contrast.

[Series 2: head wo · axial · 0.44mm/px · z∈[-441,-336]mm · 7 of 29 slices shown, 9 images]
[im 4/29  brain]
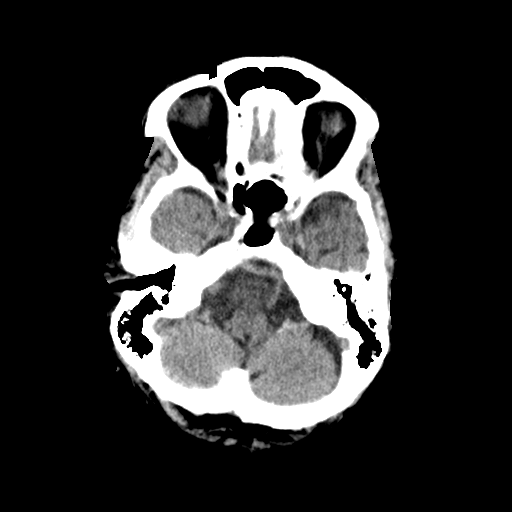
[im 4/29  bone]
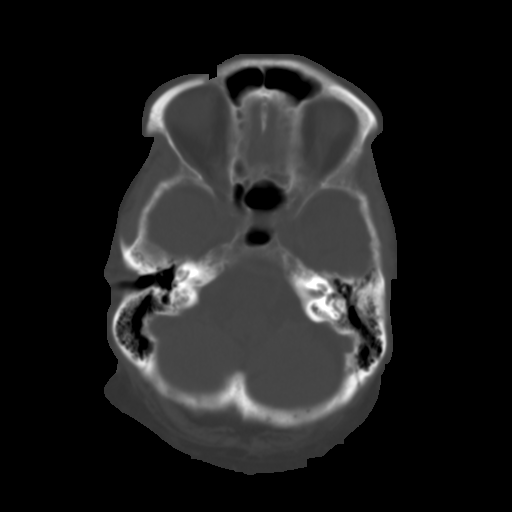
[im 8/29  brain]
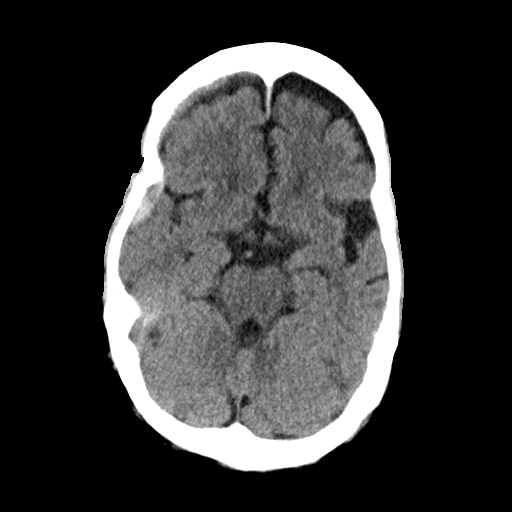
[im 11/29  brain]
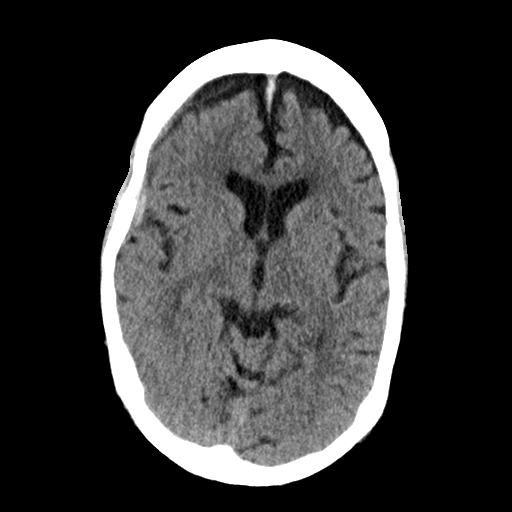
[im 15/29  brain]
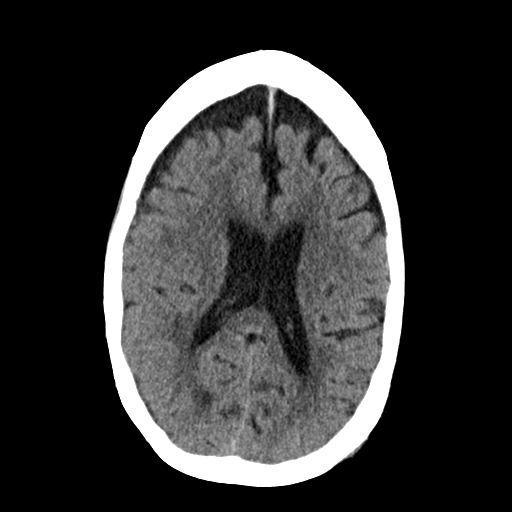
[im 18/29  brain]
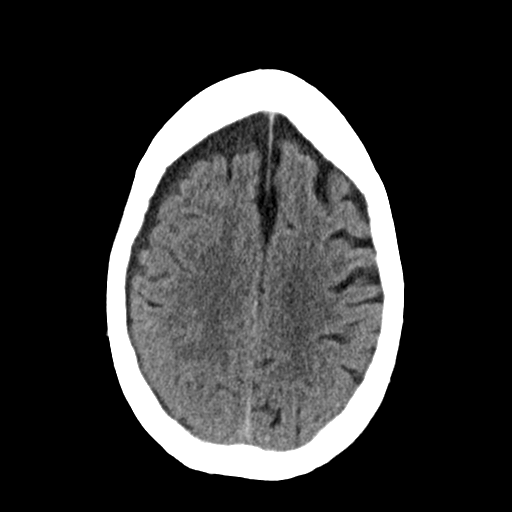
[im 18/29  bone]
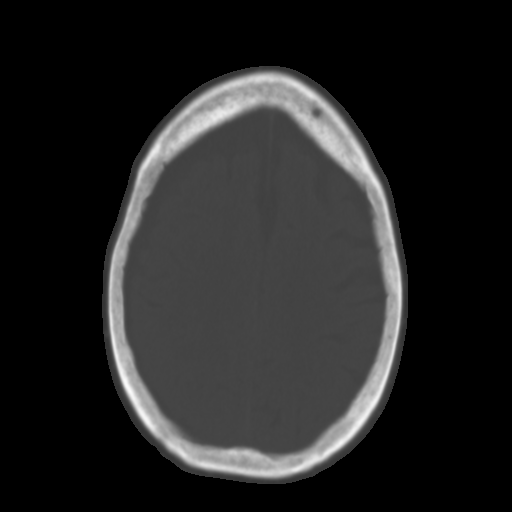
[im 22/29  brain]
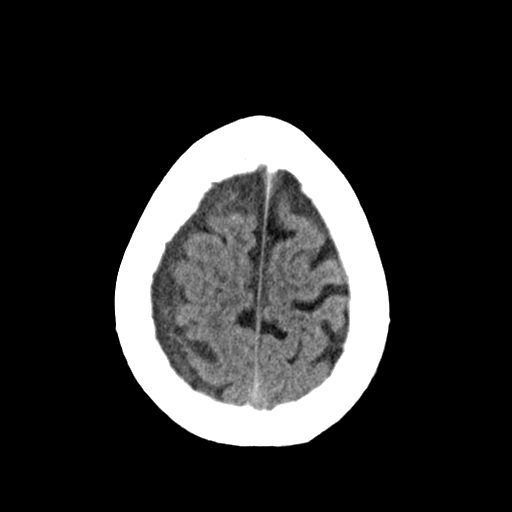
[im 25/29  brain]
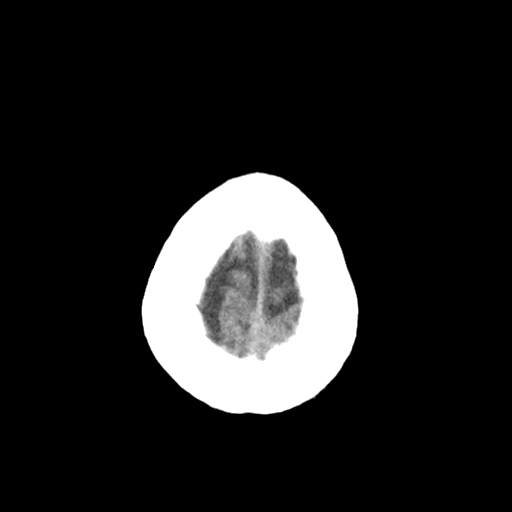

[Series 3: head bone · axial · 0.44mm/px · z∈[-442,-414]mm · 3 of 73 slices shown]
[im 8/73  bone]
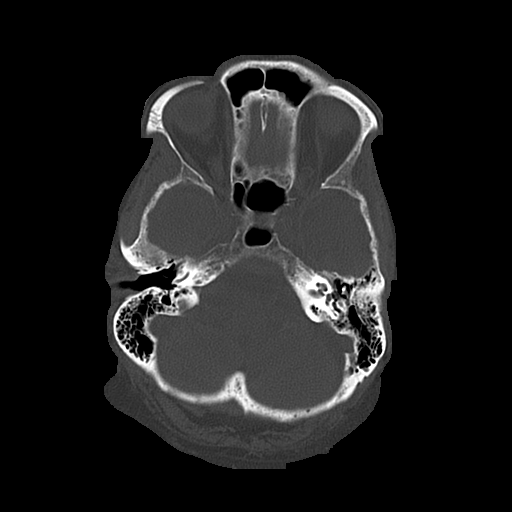
[im 15/73  bone]
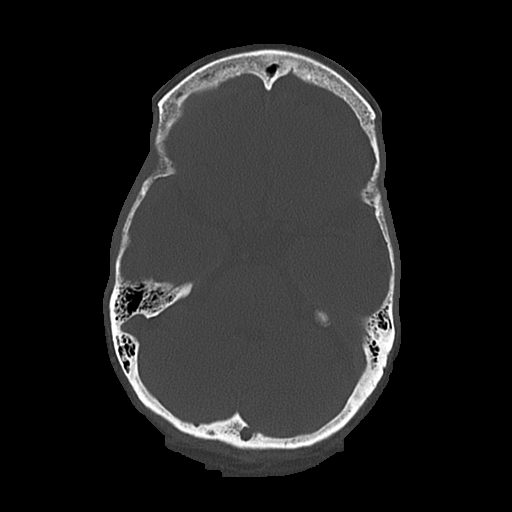
[im 22/73  bone]
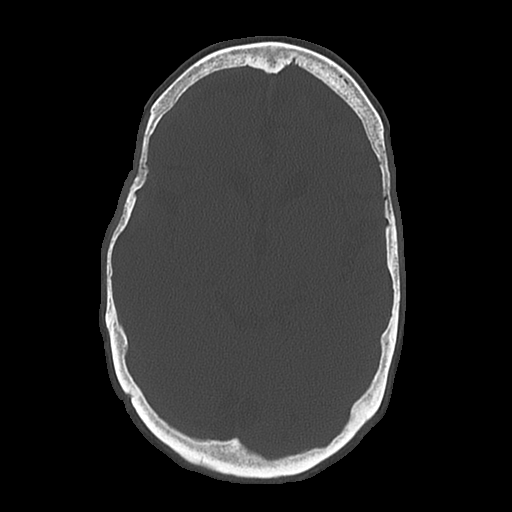

[Series 4: coronal soft · coronal · 0.29mm/px · 3 of 68 slices shown]
[im 23/68  brain]
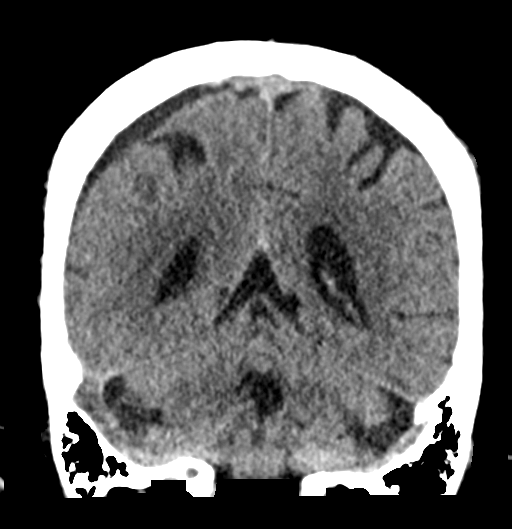
[im 30/68  brain]
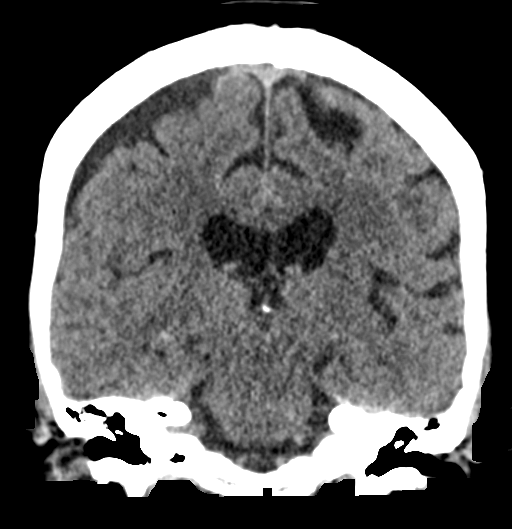
[im 38/68  brain]
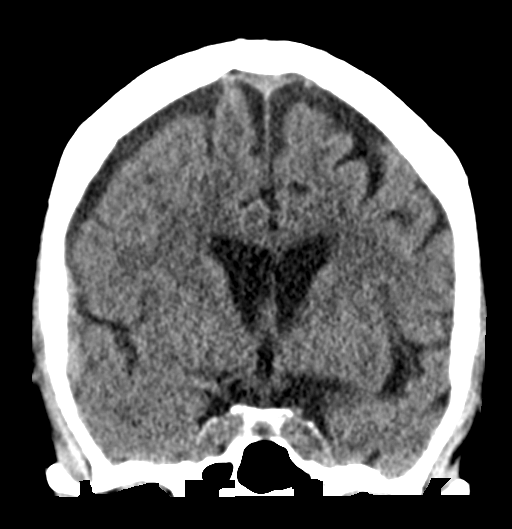

[Series 5: sagittal soft · sagittal · 0.30mm/px · 3 of 52 slices shown]
[im 18/52  brain]
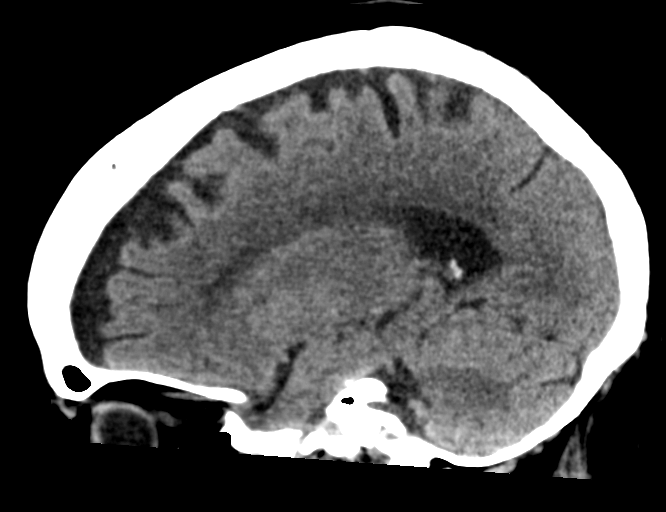
[im 26/52  brain]
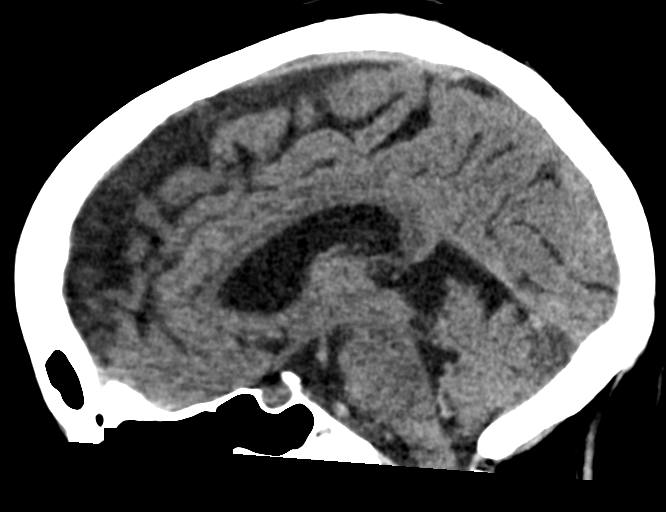
[im 35/52  brain]
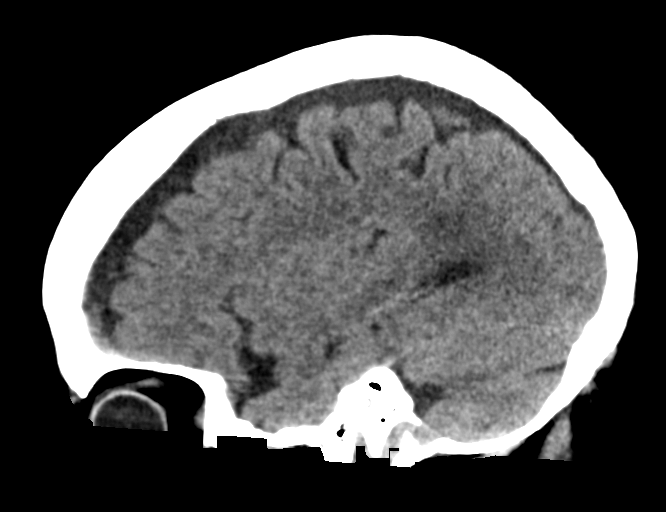

[16 of 47 positions shown; findings below may reference images not displayed]

FINDINGS: Brain: Hyperdense, biconvex collection along the right frontal and
temporal lobes, the largest part of which measures 2.7 x 0.7 x
cm (AP x TR x CC, series 2, image 9 and series 4, image 43); this
appears acute. In addition there is a small subdural hemorrhage
along the anterior right frontal lobe, measuring up to 0.3 cm. No
significant midline shift. No evidence of acute infarction,
parenchymal hemorrhage, or mass. No hydrocephalus.

Vascular: No hyperdense vessel or unexpected calcification.

Skull: Normal. Negative for fracture or focal lesion.

Sinuses/Orbits: No acute finding.

Other: None.
IMPRESSION: Small epidural and subdural hematomas along the right frontotemporal
convexity, without significant mass effect or midline shift.

These results were called by telephone at the time of interpretation
on 10/01/2020 at [DATE] to provider BALTAZAR TESHOME , who verbally
acknowledged these results.

## 2022-11-09 DIAGNOSIS — F331 Major depressive disorder, recurrent, moderate: Secondary | ICD-10-CM | POA: Diagnosis not present

## 2022-11-09 DIAGNOSIS — I10 Essential (primary) hypertension: Secondary | ICD-10-CM | POA: Diagnosis not present

## 2022-11-09 DIAGNOSIS — F419 Anxiety disorder, unspecified: Secondary | ICD-10-CM | POA: Diagnosis not present

## 2022-11-09 DIAGNOSIS — Z1211 Encounter for screening for malignant neoplasm of colon: Secondary | ICD-10-CM | POA: Diagnosis not present

## 2022-11-25 DIAGNOSIS — Z1231 Encounter for screening mammogram for malignant neoplasm of breast: Secondary | ICD-10-CM | POA: Diagnosis not present

## 2022-12-02 DIAGNOSIS — F419 Anxiety disorder, unspecified: Secondary | ICD-10-CM | POA: Diagnosis not present

## 2022-12-02 DIAGNOSIS — Z0000 Encounter for general adult medical examination without abnormal findings: Secondary | ICD-10-CM | POA: Diagnosis not present

## 2022-12-02 DIAGNOSIS — I10 Essential (primary) hypertension: Secondary | ICD-10-CM | POA: Diagnosis not present

## 2022-12-02 DIAGNOSIS — Z1231 Encounter for screening mammogram for malignant neoplasm of breast: Secondary | ICD-10-CM | POA: Diagnosis not present

## 2022-12-02 DIAGNOSIS — E785 Hyperlipidemia, unspecified: Secondary | ICD-10-CM | POA: Diagnosis not present

## 2022-12-02 DIAGNOSIS — Z124 Encounter for screening for malignant neoplasm of cervix: Secondary | ICD-10-CM | POA: Diagnosis not present

## 2022-12-02 DIAGNOSIS — Z1211 Encounter for screening for malignant neoplasm of colon: Secondary | ICD-10-CM | POA: Diagnosis not present

## 2022-12-02 DIAGNOSIS — F331 Major depressive disorder, recurrent, moderate: Secondary | ICD-10-CM | POA: Diagnosis not present

## 2022-12-02 DIAGNOSIS — Z23 Encounter for immunization: Secondary | ICD-10-CM | POA: Diagnosis not present

## 2023-01-24 DIAGNOSIS — H40012 Open angle with borderline findings, low risk, left eye: Secondary | ICD-10-CM | POA: Diagnosis not present

## 2023-01-24 DIAGNOSIS — H25013 Cortical age-related cataract, bilateral: Secondary | ICD-10-CM | POA: Diagnosis not present

## 2023-01-24 DIAGNOSIS — D3132 Benign neoplasm of left choroid: Secondary | ICD-10-CM | POA: Diagnosis not present

## 2023-01-24 DIAGNOSIS — H25042 Posterior subcapsular polar age-related cataract, left eye: Secondary | ICD-10-CM | POA: Diagnosis not present

## 2023-04-04 DIAGNOSIS — H9041 Sensorineural hearing loss, unilateral, right ear, with unrestricted hearing on the contralateral side: Secondary | ICD-10-CM | POA: Diagnosis not present

## 2023-04-06 DIAGNOSIS — H25811 Combined forms of age-related cataract, right eye: Secondary | ICD-10-CM | POA: Diagnosis not present

## 2023-04-14 DIAGNOSIS — H25811 Combined forms of age-related cataract, right eye: Secondary | ICD-10-CM | POA: Diagnosis not present

## 2023-04-26 DIAGNOSIS — H2512 Age-related nuclear cataract, left eye: Secondary | ICD-10-CM | POA: Diagnosis not present

## 2023-04-28 DIAGNOSIS — H25812 Combined forms of age-related cataract, left eye: Secondary | ICD-10-CM | POA: Diagnosis not present

## 2023-06-06 DIAGNOSIS — F419 Anxiety disorder, unspecified: Secondary | ICD-10-CM | POA: Diagnosis not present

## 2023-06-06 DIAGNOSIS — F331 Major depressive disorder, recurrent, moderate: Secondary | ICD-10-CM | POA: Diagnosis not present

## 2023-06-06 DIAGNOSIS — I10 Essential (primary) hypertension: Secondary | ICD-10-CM | POA: Diagnosis not present

## 2023-06-06 DIAGNOSIS — L723 Sebaceous cyst: Secondary | ICD-10-CM | POA: Diagnosis not present

## 2023-06-08 DIAGNOSIS — J302 Other seasonal allergic rhinitis: Secondary | ICD-10-CM | POA: Diagnosis not present

## 2023-06-08 DIAGNOSIS — L02414 Cutaneous abscess of left upper limb: Secondary | ICD-10-CM | POA: Diagnosis not present

## 2023-06-15 ENCOUNTER — Institutional Professional Consult (permissible substitution) (INDEPENDENT_AMBULATORY_CARE_PROVIDER_SITE_OTHER): Payer: Medicare Other

## 2023-06-27 ENCOUNTER — Ambulatory Visit (INDEPENDENT_AMBULATORY_CARE_PROVIDER_SITE_OTHER): Payer: Medicare Other

## 2023-06-27 ENCOUNTER — Encounter (INDEPENDENT_AMBULATORY_CARE_PROVIDER_SITE_OTHER): Payer: Self-pay

## 2023-06-27 VITALS — BP 113/71 | HR 90 | Ht 60.0 in | Wt 125.0 lb

## 2023-06-27 DIAGNOSIS — H9041 Sensorineural hearing loss, unilateral, right ear, with unrestricted hearing on the contralateral side: Secondary | ICD-10-CM | POA: Diagnosis not present

## 2023-06-28 DIAGNOSIS — H9041 Sensorineural hearing loss, unilateral, right ear, with unrestricted hearing on the contralateral side: Secondary | ICD-10-CM | POA: Insufficient documentation

## 2023-06-28 NOTE — Progress Notes (Signed)
 CC: Right ear hearing loss  HPI:  Melissa Wilkinson is a 78 y.o. female who presents today complaining of right ear hearing loss since November 2024.  She was recently evaluated at [facility].  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  The patient denies any otalgia, otorrhea, tinnitus, or vertigo.  She also denies any recent otitis media or otitis externa.  She has no previous otologic surgery.  According to the patient, she underwent multiple head CT scans 2 years ago to evaluate her subarachnoid hemorrhage, secondary to a head trauma.  According to the patient, she also had an MRI scan performed at her neurologist office.  No intracranial tumor was noted.  The patient has never worn hearing aids.  Past Medical History:  Diagnosis Date   Anxiety    Arthritis    Blood dyscrasia    family history of c protien deficiency    Complication of anesthesia    difficult waking after c section   Depression    GERD (gastroesophageal reflux disease)    Hypertension    Seasonal allergies    Wears glasses     Past Surgical History:  Procedure Laterality Date   APPENDECTOMY     CESAREAN SECTION     x2   COLONOSCOPY     DILATION AND CURETTAGE OF UTERUS     IR ANGIO INTRA EXTRACRAN SEL INTERNAL CAROTID BILAT MOD SED  11/25/2020   IR ANGIO VERTEBRAL SEL VERTEBRAL BILAT MOD SED  11/25/2020   KNEE ARTHROSCOPY  1989   left   KNEE ARTHROSCOPY WITH MEDIAL MENISECTOMY Right 09/13/2012   Procedure: RIGHT KNEE ARTHROSCOPY WITH PARTIAL MEDIAL LATERAL MENISECTOMY CHRONDROPLASTY;  Surgeon: Ferd Householder, MD;  Location: [facility];  Service: Orthopedics;  Laterality: Right;   TOTAL KNEE ARTHROPLASTY Right 03/20/2013   DR MURPHY   TOTAL KNEE ARTHROPLASTY Right 03/20/2013   Procedure: RIGHT TOTAL KNEE ARTHROPLASTY;  Surgeon: Ferd Householder, MD;  Location: [facility] OR;  Service: Orthopedics;  Laterality: Right;    History reviewed.  No pertinent family history.  Social History:  reports that she has never smoked. She has never used smokeless tobacco. She reports current alcohol use. She reports that she does not use drugs.  Allergies:  Allergies  Allergen Reactions   Sulfa Antibiotics Nausea And Vomiting   Hydrocodone  Nausea And Vomiting   Morphine And Codeine Hives and Other (See Comments)    Fever     Prior to Admission medications   Medication Sig Start Date End Date Taking? Authorizing Provider  ALPRAZolam  (XANAX ) 1 MG tablet Take 0.5-1 mg by mouth at bedtime.   Yes [provider]  buPROPion (WELLBUTRIN XL) 150 MG 24 hr tablet Take 150 mg by mouth every morning. 11/17/20  Yes [provider]  diclofenac  sodium (VOLTAREN ) 1 % GEL APPLY 2 GRAMS TO AFFECTED AREA 4 TIMES A DAY Patient taking differently: Apply 2 g topically 4 (four) times daily. 08/29/18  Yes Wes Hamman, MD  DULoxetine  (CYMBALTA ) 60 MG capsule Take 60 mg by mouth daily.   Yes [provider]  fluticasone (FLONASE) 50 MCG/ACT nasal spray Place 2 sprays into the nose daily as needed for allergies. For allergy symptoms   Yes [provider]  Fluticasone-Salmeterol (ADVAIR) 250-50 MCG/DOSE AEPB Inhale 1 puff into the lungs daily as needed (asthma).   Yes [provider]  ketotifen  (ZADITOR ) 0.025 % ophthalmic solution Place 2 drops into  both eyes daily as needed (allergies).   Yes [provider]  predniSONE  (STERAPRED UNI-PAK 21 TAB) 10 MG (21) TBPK tablet Take as directed Patient taking differently: Take 10 mg by mouth daily. 10/06/17  Yes Wes Hamman, MD  VALTREX  500 MG tablet Take 1 tablet (500 mg total) by mouth daily. 03/21/13  Yes Sandie Cross, PA-C  methocarbamol  (ROBAXIN ) 500 MG tablet Take 1 tablet (500 mg total) by mouth every 6 (six) hours as needed for muscle spasms. Patient not taking: Reported on 06/27/2023 03/20/13   Sandie Cross, PA-C  ondansetron  (ZOFRAN ) 4 MG tablet Take 1  tablet (4 mg total) by mouth every 8 (eight) hours as needed for nausea or vomiting. Patient not taking: Reported on 06/27/2023 03/20/13   Sandie Cross, PA-C  oxyCODONE -acetaminophen  (PERCOCET/ROXICET) 5-325 MG per tablet Take 1-2 tablets by mouth every 4 (four) hours as needed for severe pain. Patient not taking: Reported on 06/27/2023 03/21/13   Sandie Cross, PA-C  oxyCODONE -acetaminophen  (ROXICET) 5-325 MG per tablet Take 1-2 tablets by mouth every 4 (four) hours as needed for severe pain. Patient not taking: Reported on 06/27/2023 03/20/13   Sandie Cross, PA-C  traMADol  (ULTRAM ) 50 MG tablet Take 1 tablet (50 mg total) by mouth 2 (two) times daily as needed. Patient not taking: Reported on 06/27/2023 08/10/18   Sandie Cross, PA-C    Blood pressure 113/71, pulse 90, height 5' (1.524 m), weight 125 lb (56.7 kg), SpO2 99%. Exam: General: Communicates without difficulty, well nourished, no acute distress. Head: Normocephalic, no evidence injury, no tenderness, facial buttresses intact without stepoff. Face/sinus: No tenderness to palpation and percussion. Facial movement is normal and symmetric. Eyes: PERRL, EOMI. No scleral icterus, conjunctivae clear. Neuro: CN II exam reveals vision grossly intact.  No nystagmus at any point of gaze. Ears: Auricles well formed without lesions.  Ear canals are intact without mass or lesion.  No erythema or edema is appreciated.  The TMs are intact without fluid. Nose: External evaluation reveals normal support and skin without lesions.  Dorsum is intact.  Anterior rhinoscopy reveals normal mucosa over anterior aspect of inferior turbinates and intact septum.  No purulence noted. Oral:  Oral cavity and oropharynx are intact, symmetric, without erythema or edema.  Mucosa is moist without lesions. Neck: Full range of motion without pain.  There is no significant lymphadenopathy.  No masses palpable.  Thyroid bed within normal limits to palpation.  Parotid glands and  submandibular glands equal bilaterally without mass.  Trachea is midline. Neuro:  CN 2-12 grossly intact.   Assessment: 1.  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  The etiology is unclear. 2.  Her previous MRI scan was negative for intracranial lesion. 3.  Her ear canals, tympanic membranes, and middle ear spaces are normal.  Plan: 1.  The physical exam findings and her hearing test results are reviewed. 2.  The patient is a candidate for hearing amplification.  The patient will return to [facility] for possible hearing aid fitting. 3.  The patient will return for reevaluation in 1 year, sooner if needed.  Arleigh Dicola W Argie Applegate 06/28/2023, 9:21 AM

## 2023-07-04 DIAGNOSIS — L0291 Cutaneous abscess, unspecified: Secondary | ICD-10-CM | POA: Diagnosis not present

## 2023-07-08 DIAGNOSIS — L0291 Cutaneous abscess, unspecified: Secondary | ICD-10-CM | POA: Diagnosis not present

## 2023-07-12 DIAGNOSIS — L0291 Cutaneous abscess, unspecified: Secondary | ICD-10-CM | POA: Diagnosis not present

## 2023-09-13 DIAGNOSIS — N3000 Acute cystitis without hematuria: Secondary | ICD-10-CM | POA: Diagnosis not present

## 2023-09-13 DIAGNOSIS — N898 Other specified noninflammatory disorders of vagina: Secondary | ICD-10-CM | POA: Diagnosis not present

## 2023-09-13 DIAGNOSIS — N760 Acute vaginitis: Secondary | ICD-10-CM | POA: Diagnosis not present

## 2023-10-16 DIAGNOSIS — R1319 Other dysphagia: Secondary | ICD-10-CM | POA: Diagnosis not present

## 2023-10-16 DIAGNOSIS — Z711 Person with feared health complaint in whom no diagnosis is made: Secondary | ICD-10-CM | POA: Diagnosis not present

## 2023-10-16 DIAGNOSIS — R350 Frequency of micturition: Secondary | ICD-10-CM | POA: Diagnosis not present

## 2023-10-16 DIAGNOSIS — N390 Urinary tract infection, site not specified: Secondary | ICD-10-CM | POA: Diagnosis not present

## 2023-11-27 DIAGNOSIS — Z1231 Encounter for screening mammogram for malignant neoplasm of breast: Secondary | ICD-10-CM | POA: Diagnosis not present

## 2023-12-12 DIAGNOSIS — R300 Dysuria: Secondary | ICD-10-CM | POA: Diagnosis not present

## 2024-01-11 ENCOUNTER — Encounter: Payer: Self-pay | Admitting: Internal Medicine

## 2024-02-06 DIAGNOSIS — I10 Essential (primary) hypertension: Secondary | ICD-10-CM | POA: Diagnosis not present
